# Patient Record
Sex: Male | Born: 1955
Health system: Southern US, Community
[De-identification: ages and names within clinical notes are randomized; demographics above are authoritative.]

## PROBLEM LIST (undated history)

## (undated) DIAGNOSIS — K638219 Small intestinal bacterial overgrowth, unspecified: Secondary | ICD-10-CM

## (undated) DIAGNOSIS — Z9049 Acquired absence of other specified parts of digestive tract: Secondary | ICD-10-CM

## (undated) DIAGNOSIS — K635 Polyp of colon: Secondary | ICD-10-CM

## (undated) DIAGNOSIS — E785 Hyperlipidemia, unspecified: Secondary | ICD-10-CM

## (undated) DIAGNOSIS — G8929 Other chronic pain: Secondary | ICD-10-CM

## (undated) DIAGNOSIS — T7840XA Allergy, unspecified, initial encounter: Secondary | ICD-10-CM

## (undated) DIAGNOSIS — M5416 Radiculopathy, lumbar region: Secondary | ICD-10-CM

## (undated) DIAGNOSIS — Z8249 Family history of ischemic heart disease and other diseases of the circulatory system: Secondary | ICD-10-CM

## (undated) DIAGNOSIS — K6389 Other specified diseases of intestine: Secondary | ICD-10-CM

## (undated) DIAGNOSIS — F419 Anxiety disorder, unspecified: Secondary | ICD-10-CM

## (undated) DIAGNOSIS — Z8719 Personal history of other diseases of the digestive system: Secondary | ICD-10-CM

## (undated) DIAGNOSIS — Z8042 Family history of malignant neoplasm of prostate: Secondary | ICD-10-CM

## (undated) DIAGNOSIS — Z9889 Other specified postprocedural states: Secondary | ICD-10-CM

## (undated) DIAGNOSIS — E041 Nontoxic single thyroid nodule: Secondary | ICD-10-CM

## (undated) DIAGNOSIS — E7800 Pure hypercholesterolemia, unspecified: Secondary | ICD-10-CM

## (undated) DIAGNOSIS — L309 Dermatitis, unspecified: Secondary | ICD-10-CM

## (undated) HISTORY — PX: LUMBAR DISC SURGERY: SHX700

## (undated) HISTORY — DX: Other chronic pain: G89.29

## (undated) HISTORY — DX: Small intestinal bacterial overgrowth, unspecified: K63.8219

## (undated) HISTORY — PX: APPENDECTOMY: SHX54

## (undated) HISTORY — DX: Allergy, unspecified, initial encounter: T78.40XA

## (undated) HISTORY — DX: Acquired absence of other specified parts of digestive tract: Z90.49

## (undated) HISTORY — DX: Dermatitis, unspecified: L30.9

## (undated) HISTORY — DX: Polyp of colon: K63.5

## (undated) HISTORY — DX: Hyperlipidemia, unspecified: E78.5

## (undated) HISTORY — PX: COLONOSCOPY: SHX174

## (undated) HISTORY — DX: Family history of malignant neoplasm of prostate: Z80.42

## (undated) HISTORY — PX: INGUINAL HERNIA REPAIR: SUR1180

## (undated) HISTORY — DX: Anxiety disorder, unspecified: F41.9

## (undated) HISTORY — DX: Pure hypercholesterolemia, unspecified: E78.00

## (undated) HISTORY — DX: Personal history of other diseases of the digestive system: Z87.19

## (undated) HISTORY — DX: Family history of ischemic heart disease and other diseases of the circulatory system: Z82.49

## (undated) HISTORY — DX: Other specified diseases of intestine: K63.89

## (undated) HISTORY — DX: Other specified postprocedural states: Z98.890

---
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
## 1997-06-11 HISTORY — PX: LUMBAR DISC SURGERY: SHX700

## 2005-07-18 ENCOUNTER — Ambulatory Visit: Payer: Self-pay | Admitting: Family Medicine

## 2006-01-03 ENCOUNTER — Ambulatory Visit: Payer: Self-pay | Admitting: Family Medicine

## 2006-01-07 ENCOUNTER — Ambulatory Visit: Payer: Self-pay | Admitting: Family Medicine

## 2007-03-10 ENCOUNTER — Ambulatory Visit: Payer: Self-pay | Admitting: Family Medicine

## 2007-03-10 LAB — CONVERTED CEMR LAB
ALT: 33 units/L (ref 0–53)
AST: 30 units/L (ref 0–37)
Albumin: 4.1 g/dL (ref 3.5–5.2)
Alkaline Phosphatase: 51 units/L (ref 39–117)
BUN: 11 mg/dL (ref 6–23)
Basophils Absolute: 0 10*3/uL (ref 0.0–0.1)
Basophils Relative: 0.2 % (ref 0.0–1.0)
Bilirubin, Direct: 0.1 mg/dL (ref 0.0–0.3)
CO2: 31 meq/L (ref 19–32)
Calcium: 9.7 mg/dL (ref 8.4–10.5)
Chloride: 106 meq/L (ref 96–112)
Cholesterol: 275 mg/dL (ref 0–200)
Creatinine, Ser: 1 mg/dL (ref 0.4–1.5)
Direct LDL: 192.6 mg/dL
Eosinophils Absolute: 0.1 10*3/uL (ref 0.0–0.6)
Eosinophils Relative: 1.7 % (ref 0.0–5.0)
GFR calc Af Amer: 102 mL/min
GFR calc non Af Amer: 84 mL/min
Glucose, Bld: 92 mg/dL (ref 70–99)
HCT: 46.6 % (ref 39.0–52.0)
HDL: 62.6 mg/dL (ref >39.0)
Hemoglobin: 16.3 g/dL (ref 13.0–17.0)
Lymphocytes Relative: 32.9 % (ref 12.0–46.0)
MCHC: 34.9 g/dL (ref 30.0–36.0)
MCV: 92.1 fL (ref 78.0–100.0)
Monocytes Absolute: 0.5 10*3/uL (ref 0.2–0.7)
Monocytes Relative: 10.5 % (ref 3.0–11.0)
Neutro Abs: 2.8 10*3/uL (ref 1.4–7.7)
Neutrophils Relative %: 54.7 % (ref 43.0–77.0)
PSA: 0.45 ng/mL (ref 0.10–4.00)
Platelets: 236 10*3/uL (ref 150–400)
Potassium: 4.2 meq/L (ref 3.5–5.1)
RBC: 5.06 M/uL (ref 4.22–5.81)
RDW: 12.4 % (ref 11.5–14.6)
Sodium: 143 meq/L (ref 135–145)
TSH: 0.72 microintl units/mL (ref 0.35–5.50)
Total Bilirubin: 0.9 mg/dL (ref 0.3–1.2)
Total CHOL/HDL Ratio: 4.4
Total Protein: 6.6 g/dL (ref 6.0–8.3)
Triglycerides: 111 mg/dL (ref 0–149)
VLDL: 22 mg/dL (ref 0–40)
WBC: 5 10*3/uL (ref 4.5–10.5)

## 2007-03-17 ENCOUNTER — Ambulatory Visit: Payer: Self-pay | Admitting: Family Medicine

## 2007-03-17 DIAGNOSIS — E782 Mixed hyperlipidemia: Secondary | ICD-10-CM | POA: Insufficient documentation

## 2007-03-17 DIAGNOSIS — F411 Generalized anxiety disorder: Secondary | ICD-10-CM | POA: Insufficient documentation

## 2007-03-17 DIAGNOSIS — L259 Unspecified contact dermatitis, unspecified cause: Secondary | ICD-10-CM | POA: Insufficient documentation

## 2007-08-01 ENCOUNTER — Telehealth: Payer: Self-pay | Admitting: Family Medicine

## 2007-10-07 ENCOUNTER — Ambulatory Visit: Payer: Self-pay | Admitting: Family Medicine

## 2008-07-02 ENCOUNTER — Ambulatory Visit: Payer: Self-pay | Admitting: Family Medicine

## 2008-07-20 ENCOUNTER — Ambulatory Visit: Payer: Self-pay | Admitting: Gastroenterology

## 2009-02-22 ENCOUNTER — Ambulatory Visit: Payer: Self-pay | Admitting: Family Medicine

## 2009-02-22 DIAGNOSIS — L0202 Furuncle of face: Secondary | ICD-10-CM | POA: Insufficient documentation

## 2009-02-22 DIAGNOSIS — L0203 Carbuncle of face: Secondary | ICD-10-CM

## 2009-08-23 ENCOUNTER — Encounter: Payer: Self-pay | Admitting: Family Medicine

## 2010-03-01 ENCOUNTER — Ambulatory Visit: Payer: Self-pay | Admitting: Family Medicine

## 2010-07-09 LAB — CONVERTED CEMR LAB
ALT: 22 units/L (ref 0–53)
AST: 24 units/L (ref 0–37)
Albumin: 4.2 g/dL (ref 3.5–5.2)
Alkaline Phosphatase: 47 units/L (ref 39–117)
BUN: 13 mg/dL (ref 6–23)
Basophils Absolute: 0 10*3/uL (ref 0.0–0.1)
Basophils Relative: 0.2 % (ref 0.0–3.0)
Bilirubin Urine: NEGATIVE
Bilirubin, Direct: 0.1 mg/dL (ref 0.0–0.3)
Blood in Urine, dipstick: NEGATIVE
CO2: 31 meq/L (ref 19–32)
Calcium: 9.7 mg/dL (ref 8.4–10.5)
Chloride: 104 meq/L (ref 96–112)
Cholesterol: 202 mg/dL (ref 0–200)
Creatinine, Ser: 1 mg/dL (ref 0.4–1.5)
Direct LDL: 127.6 mg/dL
Eosinophils Absolute: 0.1 10*3/uL (ref 0.0–0.7)
Eosinophils Relative: 1.7 % (ref 0.0–5.0)
GFR calc Af Amer: 102 mL/min
GFR calc non Af Amer: 84 mL/min
Glucose, Bld: 86 mg/dL (ref 70–99)
Glucose, Urine, Semiquant: NEGATIVE
HCT: 45.1 % (ref 39.0–52.0)
HDL: 56.9 mg/dL (ref >39.0)
Hemoglobin: 15.8 g/dL (ref 13.0–17.0)
Ketones, urine, test strip: NEGATIVE
Lymphocytes Relative: 33.4 % (ref 12.0–46.0)
MCHC: 35.1 g/dL (ref 30.0–36.0)
MCV: 93.1 fL (ref 78.0–100.0)
Monocytes Absolute: 0.5 10*3/uL (ref 0.1–1.0)
Monocytes Relative: 10.9 % (ref 3.0–12.0)
Neutro Abs: 2.4 10*3/uL (ref 1.4–7.7)
Neutrophils Relative %: 53.8 % (ref 43.0–77.0)
Nitrite: NEGATIVE
PSA: 0.44 ng/mL (ref 0.10–4.00)
Platelets: 216 10*3/uL (ref 150–400)
Potassium: 4.1 meq/L (ref 3.5–5.1)
Protein, U semiquant: NEGATIVE
RBC: 4.84 M/uL (ref 4.22–5.81)
RDW: 12.3 % (ref 11.5–14.6)
Sodium: 140 meq/L (ref 135–145)
Specific Gravity, Urine: 1.02
TSH: 1.67 microintl units/mL (ref 0.35–5.50)
Total Bilirubin: 1.1 mg/dL (ref 0.3–1.2)
Total CHOL/HDL Ratio: 3.6
Total Protein: 7.1 g/dL (ref 6.0–8.3)
Triglycerides: 76 mg/dL (ref 0–149)
Urobilinogen, UA: 0.2
VLDL: 15 mg/dL (ref 0–40)
WBC Urine, dipstick: NEGATIVE
WBC: 4.5 10*3/uL (ref 4.5–10.5)
pH: 7

## 2010-07-11 NOTE — Miscellaneous (Signed)
Summary: refill  Clinical Lists Changes  Medications: Added new medication of SIMVASTATIN 20 MG TABS (SIMVASTATIN) take one tab at bedtime - Signed Rx of SIMVASTATIN 20 MG TABS (SIMVASTATIN) take one tab at bedtime;  #30 x 0;  Signed;  Entered by: Kern Reap CMA (AAMA);  Authorized by: Roderick Pee MD;  Method used: [facility]ically to [facility]  [phone]*, [address], [place], [place]  [address], Ph: [phone] or [phone], Fax: [phone]    Prescriptions: SIMVASTATIN 20 MG TABS (SIMVASTATIN) take one tab at bedtime  #30 x 0   Entered by:   Kern Reap CMA (AAMA)   Authorized by:   Roderick Pee MD   Signed by:   Kern Reap CMA (AAMA) on 08/23/2009   Method used:   Electronically to        [facility]  [phone]* (retail)       [address] [place], [place]  [address]       Ph: [phone] or [phone]       Fax: [phone]   RxID:   [id number]

## 2010-07-11 NOTE — Assessment & Plan Note (Signed)
Summary: cpx/njr rsc per pt/njr   Vital Signs:  Patient Profile:   55 Years Old Male Height:     71 inches (180.34 cm) Weight:      199 pounds (90.45 kg) Temp:     98.2 degrees F (36.78 degrees C) oral Pulse rate:   70 / minute BP sitting:   120 / 82  (right arm)  Pt. in pain?   no  Vitals Entered By: Arcola Jansky, RN (March 17, 2007 4:24 PM)                  Chief Complaint:  cpx, labs done, and 1) stress 2)hands .  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  He was hospitalized as a child for bilateral inguinal hernias and also as a teenager for an appendectomy.  He's otherwise been in good health.  No medical problems.  Review of systems pertinent positives.  He does have eczema involving his hands.  He sternum, number medications nothing is cured the problem.  It comes and goes.  He seen in number specialist from dermatology who have been unable to cure the problem.  He uses steroid cream on a regular basis and when his skin cracks take some oral prednisone for a couple days.  Rest of systems negative social history married.  His wife are having issues is currently going to a psychologist to learn how to deal with stress and anxiety.  Question taken any medication at this juncture.  Family history pertinent mother recently died in her sleep at age 53 of an MI mother at father had prostate cancer.  One brother two sisters, both in good health.  No problems.  Last tetanus unknown.  Will give him a flu and a tetanus booster today.   Current Allergies: No known allergies   Past Medical History:    Reviewed history and no changes required:       Anxiety       eczema hands, chronic and recurrent       appendectomy       bilateral inguinal hernia repair at age 16 weeks   Family History:    Reviewed history from 01/17/2007 and no changes required:       Family History Diabetes 1st degree  relative       Family History Hypertension       Family History Other cancer-Prostate       Family History of Cardiovascular disorder  Social History:    Reviewed history from 01/17/2007 and no changes required:       Never Smoked       Alcohol use-no       Married       Drug use-no       Regular exercise-yes   Risk Factors:  Tobacco use:  never Drug use:  no Exercise:  yes   Review of Systems      See HPI   Physical Exam  General:     Well-developed,well-nourished,in no acute distress; alert,appropriate and cooperative throughout examination Head:     Normocephalic and atraumatic without obvious abnormalities. No apparent alopecia or balding. Eyes:     No corneal or conjunctival inflammation noted. EOMI. Perrla. Funduscopic exam benign, without hemorrhages, exudates or papilledema. Vision grossly normal. Ears:     External ear exam shows no significant lesions or deformities.  Otoscopic examination reveals clear canals, tympanic membranes are intact bilaterally  without bulging, retraction, inflammation or discharge. Hearing is grossly normal bilaterally. Nose:     External nasal examination shows no deformity or inflammation. Nasal mucosa are pink and moist without lesions or exudates. Mouth:     Oral mucosa and oropharynx without lesions or exudates.  Teeth in good repair. Neck:     No deformities, masses, or tenderness noted. Chest Wall:     No deformities, masses, tenderness or gynecomastia noted. Breasts:     No masses or gynecomastia noted Lungs:     Normal respiratory effort, chest expands symmetrically. Lungs are clear to auscultation, no crackles or wheezes. Heart:     Normal rate and regular rhythm. S1 and S2 normal without gallop, murmur, click, rub or other extra sounds. Abdomen:     Bowel sounds positive,abdomen soft and non-tender without masses, organomegaly or hernias noted. Rectal:     No external abnormalities noted. Normal sphincter tone. No  rectal masses or tenderness. Genitalia:     Testes bilaterally descended without nodularity, tenderness or masses. No scrotal masses or lesions. No penis lesions or urethral discharge. Prostate:     Prostate gland firm and smooth, no enlargement, nodularity, tenderness, mass, asymmetry or induration. Msk:     No deformity or scoliosis noted of thoracic or lumbar spine.   Pulses:     R and L carotid,radial,femoral,dorsalis pedis and posterior tibial pulses are full and equal bilaterally Extremities:     No clubbing, cyanosis, edema, or deformity noted with normal full range of motion of all joints.   Neurologic:     No cranial nerve deficits noted. Station and gait are normal. Plantar reflexes are down-going bilaterally. DTRs are symmetrical throughout. Sensory, motor and coordinative functions appear intact. Skin:     Intact without suspicious lesions or rashes Cervical Nodes:     No lymphadenopathy noted Axillary Nodes:     No palpable lymphadenopathy Inguinal Nodes:     No significant adenopathy Psych:     Cognition and judgment appear intact. Alert and cooperative with normal attention span and concentration. No apparent delusions, illusions, hallucinations    Impression & Recommendations:  Problem # 1:  HEALTH SCREENING (ICD-V70.0) Assessment: Unchanged  Orders: EKG w/ Interpretation (93000)   Problem # 2:  ANXIETY (ICD-300.00) Assessment: Unchanged  His updated medication list for this problem includes:    Celexa 20 Mg Tabs (Citalopram hydrobromide) .Marland Kitchen... 1 tab @ bedtime   Problem # 3:  DERMATITIS, CONTACT, NOS (ICD-692.9) lot [id number], EXP 30 jun 09, sanofi pasteur left deltoid IM, 0.5 cc.  The following medications were removed from the medication list:    Prednisone 20 Mg Tabs (Prednisone)  His updated medication list for this problem includes:    Prednisone 20 Mg Tabs (Prednisone) ..... Uad    Lidex 0.05 % Crea (Fluocinonide) .Marland Kitchen... Apply qhs   Problem #  4:  HYPERLIPIDEMIA, MIXED (ICD-272.2) Assessment: New  His updated medication list for this problem includes:    Zocor 20 Mg Tabs (Simvastatin) .Marland Kitchen... 1 tab @ bedtime   Complete Medication List: 1)  Prednisone 20 Mg Tabs (Prednisone) .... Uad 2)  Lidex 0.05 % Crea (Fluocinonide) .... Apply qhs 3)  Celexa 20 Mg Tabs (Citalopram hydrobromide) .Marland Kitchen.. 1 tab @ bedtime 4)  Zocor 20 Mg Tabs (Simvastatin) .Marland Kitchen.. 1 tab @ bedtime  Other Orders: Flu Vaccine 16yrs + (09604) Admin 1st Vaccine (54098) Tetanus Toxoid w/Dx (11914) Admin of Any Addtl Vaccine (78295)   Patient Instructions: 1)  Please schedule a follow-up appointment  in 1 year. 2)  It is important that you exercise regularly at least 20 minutes 5 times a week. If you develop chest pain, have severe difficulty breathing, or feel very tired , stop exercising immediately and seek medical attention. 3)  Take an Aspirin every day. 4)  I would like to the prescription for the Lidex ointment/cream to use if he has severe eczema.  Also I will write you triamcinolone with Eucerin to take for minor problems.  Also, right some prednisone to take two a day until the hand eczema quiets down when it becomes severe.  He also might benefit from taking a mild dose of Celexa 20 mg at bedtime to see if that would help with her mood and anxiety.  Return p.r.n. 5)  return two months for fasting lipid and liver panel, code number 272.0     Prescriptions: ZOCOR 20 MG  TABS (SIMVASTATIN) 1 tab @ bedtime  #100 x 4   Entered and Authorized by:   Roderick Pee MD   Signed by:   Roderick Pee MD on 03/17/2007   Method used:   Print then Give to Patient   RxID:   [id number] CELEXA 20 MG  TABS (CITALOPRAM HYDROBROMIDE) 1 tab @ bedtime  #100 x 4   Entered and Authorized by:   Roderick Pee MD   Signed by:   Roderick Pee MD on 03/17/2007   Method used:   Print then Give to Patient   RxID:   [id number] LIDEX 0.05 %  CREA (FLUOCINONIDE) apply qhs   #60 grms x 4   Entered and Authorized by:   Roderick Pee MD   Signed by:   Roderick Pee MD on 03/17/2007   Method used:   Print then Give to Patient   RxID:   [id number] PREDNISONE 20 MG  TABS (PREDNISONE) uad  #50 x 2   Entered and Authorized by:   Roderick Pee MD   Signed by:   Roderick Pee MD on 03/17/2007   Method used:   Print then Give to Patient   RxID:   [id number]  ]  Tetanus/Td Vaccine    Vaccine Type: Td    Site: right deltoid    Mfr: Sanofi Pasteur    Dose: 0.5 ml    Route: IM    Given by: Arcola Jansky, RN    Exp. Date: 08/27/2008    Lot #: [id number]    VIS given: 12/20/04 version given March 17, 2007.  Influenza Vaccine    Vaccine Type: Fluvax 3+    Given by: Arcola Jansky, RN  Flu Vaccine Consent Questions    Do you have a history of severe allergic reactions to this vaccine? no    Any prior history of allergic reactions to egg and/or gelatin? no    Do you have a sensitivity to the preservative Thimersol? no    Do you have a past history of Guillan-Barre Syndrome? no    Do you currently have an acute febrile illness? no    Have you ever had a severe reaction to latex? no    Vaccine information given and explained to patient? yes
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
## 2010-07-11 NOTE — Assessment & Plan Note (Signed)
Summary: cpx patient fasting/mhf   Vital Signs:  Patient Profile:   55 Years Old Male Height:     69.5 inches (180.34 cm) Weight:      198 pounds Temp:     98.3 degrees F oral Pulse rate:   68 / minute BP sitting:   120 / 80  (left arm) Cuff size:   regular  Vitals Entered By: Kern Reap CMA (July 02, 2008 8:24 AM)                 Chief Complaint:  cpx.  History of Present Illness: Mason Irwin is a 55 year old, married male, nonsmoker, who comes in today for a physical evaluation because of underlying hyperlipidemia eczema and anxiety.  His hyperlipidemia.  His been managed with Zocor 20 mg nightly.  He did not get his fasting labs last week.  Will get them today.  The eczema history with over-the-counter medications.  He is on how to deal with it.  Anxiety is lessened.  Is not taking Celexa.  Last tetanus booster 2008 seasonal flu 2009 will give him an H1 N1 today.  Patient due for screening colonoscopy.  Will set that up in GI    Updated Prior Medication List: PREDNISONE 20 MG  TABS (PREDNISONE) uad CELEXA 20 MG  TABS (CITALOPRAM HYDROBROMIDE) 1 tab @ bedtime--prn ZOCOR 20 MG  TABS (SIMVASTATIN) 1 tab @ bedtime--sometimes  Current Allergies: No known allergies   Past Medical History:    Reviewed history from 03/17/2007 and no changes required:       Anxiety       eczema hands, chronic and recurrent       appendectomy       bilateral inguinal hernia repair at age 90 weeks  Past Surgical History:    Reviewed history from 01/17/2007 and no changes required:       Appendectomy   Family History:    Reviewed history from 01/17/2007 and no changes required:       Family History Diabetes 1st degree relative       Family History Hypertension       Family History Other cancer-Prostate       Family History of Cardiovascular disorder  Social History:    Reviewed history from 03/17/2007 and no changes required:       Never Smoked       Alcohol use-no  Married       Drug use-no       Regular exercise-yes    Review of Systems      See HPI   Physical Exam  General:     Well-developed,well-nourished,in no acute distress; alert,appropriate and cooperative throughout examination Head:     Normocephalic and atraumatic without obvious abnormalities. No apparent alopecia or balding. Eyes:     No corneal or conjunctival inflammation noted. EOMI. Perrla. Funduscopic exam benign, without hemorrhages, exudates or papilledema. Vision grossly normal. Ears:     External ear exam shows no significant lesions or deformities.  Otoscopic examination reveals clear canals, tympanic membranes are intact bilaterally without bulging, retraction, inflammation or discharge. Hearing is grossly normal bilaterally. Nose:     External nasal examination shows no deformity or inflammation. Nasal mucosa are pink and moist without lesions or exudates. Mouth:     Oral mucosa and oropharynx without lesions or exudates.  Teeth in good repair. Neck:     No deformities, masses, or tenderness noted. Chest Wall:     No deformities, masses, tenderness or gynecomastia noted.  Breasts:     No masses or gynecomastia noted Lungs:     Normal respiratory effort, chest expands symmetrically. Lungs are clear to auscultation, no crackles or wheezes. Heart:     Normal rate and regular rhythm. S1 and S2 normal without gallop, murmur, click, rub or other extra sounds. Abdomen:     Bowel sounds positive,abdomen soft and non-tender without masses, organomegaly or hernias noted. Rectal:     No external abnormalities noted. Normal sphincter tone. No rectal masses or tenderness. Genitalia:     Testes bilaterally descended without nodularity, tenderness or masses. No scrotal masses or lesions. No penis lesions or urethral discharge. Prostate:     Prostate gland firm and smooth, no enlargement, nodularity, tenderness, mass, asymmetry or induration. Msk:     No deformity or scoliosis  noted of thoracic or lumbar spine.   Pulses:     R and L carotid,radial,femoral,dorsalis pedis and posterior tibial pulses are full and equal bilaterally Extremities:     No clubbing, cyanosis, edema, or deformity noted with normal full range of motion of all joints.   Neurologic:     No cranial nerve deficits noted. Station and gait are normal. Plantar reflexes are down-going bilaterally. DTRs are symmetrical throughout. Sensory, motor and coordinative functions appear intact. Skin:     Intact without suspicious lesions or rashes Cervical Nodes:     No lymphadenopathy noted Axillary Nodes:     No palpable lymphadenopathy Inguinal Nodes:     No significant adenopathy Psych:     Cognition and judgment appear intact. Alert and cooperative with normal attention span and concentration. No apparent delusions, illusions, hallucinations    Impression & Recommendations:  Problem # 1:  HYPERLIPIDEMIA, MIXED (ICD-272.2) Assessment: Unchanged  His updated medication list for this problem includes:    Zocor 20 Mg Tabs (Simvastatin) .Marland Kitchen... 1 tab @ bedtime--sometimes  Orders: Venipuncture (60454) TLB-Lipid Panel (80061-LIPID) TLB-BMP (Basic Metabolic Panel-BMET) (80048-METABOL) TLB-CBC Platelet - w/Differential (85025-CBCD) TLB-Hepatic/Liver Function Pnl (80076-HEPATIC) TLB-TSH (Thyroid Stimulating Hormone) (84443-TSH) TLB-PSA (Prostate Specific Antigen) (84153-PSA) UA Dipstick w/o Micro (automated)  (81003) EKG w/ Interpretation (93000)   Problem # 2:  DERMATITIS, CONTACT, NOS (ICD-692.9) Assessment: Unchanged  The following medications were removed from the medication list:    Lidex 0.05 % Crea (Fluocinonide) .Marland Kitchen... Apply at bedtime-as needed  His updated medication list for this problem includes:    Prednisone 20 Mg Tabs (Prednisone) ..... Uad  Orders: Venipuncture (09811) TLB-Lipid Panel (80061-LIPID) TLB-BMP (Basic Metabolic Panel-BMET) (80048-METABOL) TLB-CBC Platelet -  w/Differential (85025-CBCD) TLB-Hepatic/Liver Function Pnl (80076-HEPATIC) TLB-TSH (Thyroid Stimulating Hormone) (84443-TSH) TLB-PSA (Prostate Specific Antigen) (84153-PSA) UA Dipstick w/o Micro (automated)  (81003)   Problem # 3:  ANXIETY (ICD-300.00) Assessment: Improved  His updated medication list for this problem includes:    Celexa 20 Mg Tabs (Citalopram hydrobromide) .Marland Kitchen... 1 tab @ bedtime--prn  Orders: Venipuncture (91478) TLB-Lipid Panel (80061-LIPID) TLB-BMP (Basic Metabolic Panel-BMET) (80048-METABOL) TLB-CBC Platelet - w/Differential (85025-CBCD) TLB-Hepatic/Liver Function Pnl (80076-HEPATIC) TLB-TSH (Thyroid Stimulating Hormone) (84443-TSH) TLB-PSA (Prostate Specific Antigen) (84153-PSA) UA Dipstick w/o Micro (automated)  (81003)   Complete Medication List: 1)  Prednisone 20 Mg Tabs (Prednisone) .... Uad 2)  Celexa 20 Mg Tabs (Citalopram hydrobromide) .Marland Kitchen.. 1 tab @ bedtime--prn 3)  Zocor 20 Mg Tabs (Simvastatin) .Marland Kitchen.. 1 tab @ bedtime--sometimes  Other Orders: Gastroenterology Referral (GI) H1N1 vaccine G code (G9562) Influenza A (H1N1) adm  fee Medicare/Non Medicare [phone])   Patient Instructions: 1)  Please schedule a follow-up appointment in 1 year. 2)  Schedule a colonoscopy/sigmoidoscopy to help detect colon cancer. 3)  Take an Aspirin every day.   Prescriptions: ZOCOR 20 MG  TABS (SIMVASTATIN) 1 tab @ bedtime--sometimes  #100 Tablet x 4   Entered and Authorized by:   Roderick Pee MD   Signed by:   Roderick Pee MD on 07/02/2008   Method used:   [facility] to        [facility]  [phone]* (retail)       [address] [place], [place]  [address]       Ph: [phone] or [phone]       Fax: [phone]   RxID:   [id number]    H1N1 # 1    Vaccine Type: H1N1 vaccine G code    Site: left deltoid    Mfr: novartis    Dose: 0.5 ml    Route: IM    Given by: Kern Reap CMA    Exp. Date: 09/09/2008    Lot #:  [id number]   Laboratory Results   Urine Tests   Date/Time Reported: July 02, 2008 12:47 PM   Routine Urinalysis   Color: yellow Appearance: Clear Glucose: negative   (Normal Range: Negative) Bilirubin: negative   (Normal Range: Negative) Ketone: negative   (Normal Range: Negative) Spec. Gravity: 1.020   (Normal Range: 1.003-1.035) Blood: negative   (Normal Range: Negative) pH: 7.0   (Normal Range: 5.0-8.0) Protein: negative   (Normal Range: Negative) Urobilinogen: 0.2   (Normal Range: 0-1) Nitrite: negative   (Normal Range: Negative) Leukocyte Esterace: negative   (Normal Range: Negative)    Comments: Wynona Canes, CMA  July 02, 2008 12:47 PM       ANTICOAGULATION RECORD       NEW REGIMEN & LAB RESULTS Regimen:   (no change)  MEDICATIONS PREDNISONE 20 MG  TABS (PREDNISONE) uad CELEXA 20 MG  TABS (CITALOPRAM HYDROBROMIDE) 1 tab @ bedtime--prn ZOCOR 20 MG  TABS (SIMVASTATIN) 1 tab @ bedtime--sometimes

## 2010-07-11 NOTE — Assessment & Plan Note (Signed)
Summary: FLU SHOT // RS  Nurse Visit   Vitals Entered By: Duard Brady LPN (March 01, 2010 4:10 PM)  Allergies: No Known Drug Allergies  Orders Added: 1)  Admin 1st Vaccine [90471] 2)  Flu Vaccine 5yrs + [16109] Flu Vaccine Consent Questions     Do you have a history of severe allergic reactions to this vaccine? no    Any prior history of allergic reactions to egg and/or gelatin? no    Do you have a sensitivity to the preservative Thimersol? no    Do you have a past history of Guillan-Barre Syndrome? no    Do you currently have an acute febrile illness? no    Have you ever had a severe reaction to latex? no    Vaccine information given and explained to patient? yes    Are you currently pregnant? no    Lot Number:[id number]   Exp Date:12/09/2010   Site Given  Left Deltoid IM .lbflu

## 2010-07-11 NOTE — Miscellaneous (Signed)
Summary: [facility] Previsit/prep  Clinical Lists Changes  Medications: Added new medication of MOVIPREP 100 GM  SOLR (PEG-KCL-NACL-NASULF-NA ASC-C) As per prep instructions. - Signed Rx of MOVIPREP 100 GM  SOLR (PEG-KCL-NACL-NASULF-NA ASC-C) As per prep instructions.;  #1 x 0;  Signed;  Entered by: Wyona Almas RN;  Authorized by: Meryl Dare MD [facility];  Method used: [facility]  [phone]*, [address], [place], [place]  [address], Ph: [phone] or [phone], Fax: [phone] Observations: Added new observation of NKA: T (07/20/2008 15:25)    Prescriptions: MOVIPREP 100 GM  SOLR (PEG-KCL-NACL-NASULF-NA ASC-C) As per prep instructions.  #1 x 0   Entered by:   Wyona Almas RN   Authorized by:   Meryl Dare MD [facility]   Signed by:   Wyona Almas RN on 07/20/2008   Method used:   Electronically to        [facility]  [phone]* (retail)       [address] [place], [place]  [address]       Ph: [phone] or [phone]       Fax: [phone]   RxID:   [id number]

## 2010-07-11 NOTE — Progress Notes (Signed)
Summary: refill zocor  Phone Note Call from Patient Call back at [phone]   Caller: patient wife Call For: Derico Mitton Summary of Call: Dr Tawanna Cooler gave him an rx for heart medicine and the patient never had it filled.  He has lost the rx.  Could Dr Tawanna Cooler call in to [facility] [address].   Initial call taken by: Roselle Locus,  August 01, 2007 12:02 PM  Follow-up for Phone Call        No heart medicine on list.  Called and asked pt to call back with med he is missing Rx for. Follow-up by: Gladis Riffle, RN,  August 01, 2007 4:06 PM  Additional Follow-up for Phone Call Additional follow up Details #1::        Zocor 20 mg, dispense 100 tablets directions are one at bed time with a follow-up lipid panel and liver panel in April.  Code number 272.0 Additional Follow-up by: Roderick Pee MD,  August 04, 2007 5:56 PM    Additional Follow-up for Phone Call Additional follow up Details #2::    zocor called to pharm Follow-up by: Sindy Guadeloupe RN,  August 05, 2007 10:07 AM  New/Updated Medications: ZOCOR 20 MG  TABS (SIMVASTATIN) 1 tab @ bedtime   Prescriptions: ZOCOR 20 MG  TABS (SIMVASTATIN) 1 tab @ bedtime  #100 x 0   Entered by:   Sindy Guadeloupe RN   Authorized by:   Roderick Pee MD   Signed by:   Sindy Guadeloupe RN on 08/05/2007   Method used:   Telephoned to ...       [facility]  [phone]*       [address]       [place], [place]  [address]       Ph: [phone] or [phone]       Fax: [phone]   RxID:   [id number]

## 2010-07-11 NOTE — Assessment & Plan Note (Signed)
Summary: reck meds/jls   Vital Signs:  Patient Profile:   55 Years Old Male Height:     71 inches (180.34 cm) Weight:      189 pounds Temp:     98.3 degrees F BP sitting:   116 / 76  Vitals Entered By: Sindy Guadeloupe RN (October 07, 2007 9:33 AM)                 Chief Complaint:  follow up--reck meds--fasting.  History of Present Illness: Mason Irwin is a 55 year old male, who comes in today for follow-up of hyperlipidemia.  We saw him last fall and had started Zocor 20 mg nightly as a starting dose.  He has marked elevation of his total and LDL and his father had bypass surgery.  Age 93.  The risk factors father had with the hyperlipidemia.  He is not been taking his Zocor on a regular basis.  He comes in today fasting wanting follow-up labs.  I explained to him that we would need to take the medication and a daily basis before we could judge the efficacy of the 20 mg dose.  We therefore get him back in June for follow-up.  I spent 15 minutes going through the risk factors for coronary disease with hyperlipidemia.    Current Allergies (reviewed today): No known allergies       Physical Exam  General:     Well-developed,well-nourished,in no acute distress; alert,appropriate and cooperative throughout examination    Impression & Recommendations:  Problem # 1:  HYPERLIPIDEMIA, MIXED (ICD-272.2) Assessment: Unchanged  His updated medication list for this problem includes:    Zocor 20 Mg Tabs (Simvastatin) .Marland Kitchen... 1 tab @ bedtime--sometimes   Complete Medication List: 1)  Prednisone 20 Mg Tabs (Prednisone) .... Uad 2)  Lidex 0.05 % Crea (Fluocinonide) .... Apply at bedtime-as needed 3)  Celexa 20 Mg Tabs (Citalopram hydrobromide) .Marland Kitchen.. 1 tab @ bedtime--prn 4)  Zocor 20 Mg Tabs (Simvastatin) .Marland Kitchen.. 1 tab @ bedtime--sometimes   Patient Instructions: 1)  takes Zocor 20 mg every night at bedtime.  Return in two months for follow-up lipid and liver panel.  Code number  272.0 2)  Please return for lab work one (1) week before your next appointment.     ]

## 2010-08-06 ENCOUNTER — Other Ambulatory Visit: Payer: Self-pay | Admitting: Family Medicine

## 2010-08-07 ENCOUNTER — Telehealth: Payer: Self-pay | Admitting: Family Medicine

## 2010-08-07 DIAGNOSIS — E785 Hyperlipidemia, unspecified: Secondary | ICD-10-CM

## 2010-08-07 MED ORDER — SIMVASTATIN 20 MG PO TABS: 20.0000 mg | ORAL_TABLET | Freq: Every day | ORAL | Status: DC

## 2010-08-07 NOTE — Telephone Encounter (Signed)
Refill Simvastatin 20mg ...[facility].

## 2010-10-27 ENCOUNTER — Telehealth: Payer: Self-pay | Admitting: Family Medicine

## 2010-10-27 NOTE — Telephone Encounter (Signed)
Pt called and needs refill of [facility]atin [facility] [address]

## 2010-10-27 NOTE — Telephone Encounter (Signed)
Pt has been sch for ov as noted.

## 2010-10-27 NOTE — Telephone Encounter (Signed)
patient  Needs to schedule an office visit for more refills.

## 2010-11-09 ENCOUNTER — Ambulatory Visit (INDEPENDENT_AMBULATORY_CARE_PROVIDER_SITE_OTHER): Payer: BC Managed Care – PPO | Admitting: Family Medicine

## 2010-11-09 ENCOUNTER — Encounter: Payer: Self-pay | Admitting: Family Medicine

## 2010-11-09 VITALS — BP 110/70 | Ht 71.0 in | Wt 196.0 lb

## 2010-11-09 DIAGNOSIS — B009 Herpesviral infection, unspecified: Secondary | ICD-10-CM

## 2010-11-09 DIAGNOSIS — E782 Mixed hyperlipidemia: Secondary | ICD-10-CM

## 2010-11-09 DIAGNOSIS — E785 Hyperlipidemia, unspecified: Secondary | ICD-10-CM

## 2010-11-09 DIAGNOSIS — Z0000 Encounter for general adult medical examination without abnormal findings: Secondary | ICD-10-CM

## 2010-11-09 LAB — POCT URINALYSIS DIPSTICK
Bilirubin, UA: NEGATIVE
Blood, UA: NEGATIVE
Glucose, UA: NEGATIVE
Ketones, UA: NEGATIVE
Leukocytes, UA: NEGATIVE
Nitrite, UA: NEGATIVE
Protein, UA: NEGATIVE
Spec Grav, UA: 1.02
Urobilinogen, UA: 0.2
pH, UA: 7

## 2010-11-09 LAB — LIPID PANEL
Cholesterol: 222 mg/dL — ABNORMAL HIGH (ref 0–200)
HDL: 75.9 mg/dL (ref >39.00)
Total CHOL/HDL Ratio: 3
Triglycerides: 120 mg/dL (ref 0.0–149.0)
VLDL: 24 mg/dL (ref 0.0–40.0)

## 2010-11-09 LAB — PSA: PSA: 0.71 ng/mL (ref 0.10–4.00)

## 2010-11-09 LAB — CBC WITH DIFFERENTIAL/PLATELET
Basophils Absolute: 0 10*3/uL (ref 0.0–0.1)
Basophils Relative: 0.3 % (ref 0.0–3.0)
Eosinophils Absolute: 0 10*3/uL (ref 0.0–0.7)
Eosinophils Relative: 0.3 % (ref 0.0–5.0)
HCT: 42.1 % (ref 39.0–52.0)
Hemoglobin: 14.7 g/dL (ref 13.0–17.0)
Lymphocytes Relative: 21.1 % (ref 12.0–46.0)
Lymphs Abs: 1.7 10*3/uL (ref 0.7–4.0)
MCHC: 34.8 g/dL (ref 30.0–36.0)
MCV: 94.8 fl (ref 78.0–100.0)
Monocytes Absolute: 0.5 10*3/uL (ref 0.1–1.0)
Monocytes Relative: 6.7 % (ref 3.0–12.0)
Neutro Abs: 5.6 10*3/uL (ref 1.4–7.7)
Neutrophils Relative %: 71.6 % (ref 43.0–77.0)
Platelets: 213 10*3/uL (ref 150.0–400.0)
RBC: 4.44 Mil/uL (ref 4.22–5.81)
RDW: 13.4 % (ref 11.5–14.6)
WBC: 7.9 10*3/uL (ref 4.5–10.5)

## 2010-11-09 LAB — HEPATIC FUNCTION PANEL
ALT: 21 U/L (ref 0–53)
AST: 24 U/L (ref 0–37)
Albumin: 4.4 g/dL (ref 3.5–5.2)
Alkaline Phosphatase: 43 U/L (ref 39–117)
Bilirubin, Direct: 0.1 mg/dL (ref 0.0–0.3)
Total Bilirubin: 0.7 mg/dL (ref 0.3–1.2)
Total Protein: 7 g/dL (ref 6.0–8.3)

## 2010-11-09 LAB — BASIC METABOLIC PANEL
BUN: 23 mg/dL (ref 6–23)
CO2: 30 mEq/L (ref 19–32)
Calcium: 9.8 mg/dL (ref 8.4–10.5)
Chloride: 104 mEq/L (ref 96–112)
Creatinine, Ser: 1.6 mg/dL — ABNORMAL HIGH (ref 0.4–1.5)
GFR: 49.69 mL/min — ABNORMAL LOW (ref >60.00)
Glucose, Bld: 83 mg/dL (ref 70–99)
Potassium: 4.1 mEq/L (ref 3.5–5.1)
Sodium: 140 mEq/L (ref 135–145)

## 2010-11-09 LAB — TSH: TSH: 1.3 u[IU]/mL (ref 0.35–5.50)

## 2010-11-09 MED ORDER — ACYCLOVIR 400 MG PO TABS: ORAL_TABLET | ORAL | Status: DC

## 2010-11-09 MED ORDER — SIMVASTATIN 20 MG PO TABS: 20.0000 mg | ORAL_TABLET | Freq: Every day | ORAL | Status: DC

## 2010-11-09 NOTE — Progress Notes (Signed)
  Subjective:    Patient ID: Mason Irwin, male    DOB: 1956-03-11, 55 y.o.   MRN: [id number]  HPI Mason Irwin is a 55 y/o male married, nonsmoker, who comes in today for general physical examination because of a history of hyperlipidemia.  He takes Zocor 20 mg nightly Will check labs today.  Also, advised to start taking 81 mg baby aspirin.  He takes Valtrex p.r.n. For outbreak of HSV.  He gets routine eye care, dental care, tetanus up-to-date, do colonoscopy   Review of Systems  Constitutional: Negative.   HENT: Negative.   Eyes: Negative.   Respiratory: Negative.   Cardiovascular: Negative.   Gastrointestinal: Negative.   Genitourinary: Negative.   Musculoskeletal: Negative.   Skin: Negative.   Neurological: Negative.   Hematological: Negative.   Psychiatric/Behavioral: Negative.        Objective:   Physical Exam  Constitutional: He is oriented to person, place, and time. He appears well-developed and well-nourished.  HENT:  Head: Normocephalic and atraumatic.  Right Ear: External ear normal.  Left Ear: External ear normal.  Nose: Nose normal.  Mouth/Throat: Oropharynx is clear and moist.  Eyes: Conjunctivae and EOM are normal. Pupils are equal, round, and reactive to light.  Neck: Normal range of motion. Neck supple. No JVD present. No tracheal deviation present. No thyromegaly present.  Cardiovascular: Normal rate, regular rhythm, normal heart sounds and intact distal pulses.  Exam reveals no gallop and no friction rub.   No murmur heard. Pulmonary/Chest: Effort normal and breath sounds normal. No stridor. No respiratory distress. He has no wheezes. He has no rales. He exhibits no tenderness.  Abdominal: Soft. Bowel sounds are normal. He exhibits no distension and no mass. There is no tenderness. There is no rebound and no guarding.  Genitourinary: Rectum normal, prostate normal and penis normal. Guaiac negative stool. No penile tenderness.  Musculoskeletal: Normal  range of motion. He exhibits no edema and no tenderness.  Lymphadenopathy:    He has no cervical adenopathy.  Neurological: He is alert and oriented to person, place, and time. He has normal reflexes. No cranial nerve deficit. He exhibits normal muscle tone.  Skin: Skin is warm and dry. No rash noted. No erythema. No pallor.  Psychiatric: He has a normal mood and affect. His behavior is normal. Judgment and thought content normal.          Assessment & Plan:  Healthy male.  Hyperlipidemia.  Continue Zocor and aspirin.  History of HSV.  Ultrex p.r.n.  Refer for screening colonoscopy

## 2010-11-09 NOTE — Patient Instructions (Signed)
Continue the Zocor, 20 mg daily, along with a baby aspirin.  Acyclovir 400 mg two tabs twice daily as needed.  Return one year or sooner if any problems.  We will call you the report of your lab work within two weeks

## 2010-11-10 LAB — LDL CHOLESTEROL, DIRECT: Direct LDL: 145.1 mg/dL

## 2010-11-15 NOTE — Progress Notes (Signed)
patient  Is aware and lab appointment made 

## 2010-11-20 ENCOUNTER — Encounter: Payer: Self-pay | Admitting: Gastroenterology

## 2010-11-28 ENCOUNTER — Other Ambulatory Visit (INDEPENDENT_AMBULATORY_CARE_PROVIDER_SITE_OTHER): Payer: BC Managed Care – PPO

## 2010-11-28 DIAGNOSIS — I10 Essential (primary) hypertension: Secondary | ICD-10-CM

## 2010-11-28 LAB — BASIC METABOLIC PANEL
BUN: 16 mg/dL (ref 6–23)
CO2: 29 mEq/L (ref 19–32)
Calcium: 9.5 mg/dL (ref 8.4–10.5)
Chloride: 104 mEq/L (ref 96–112)
Creatinine, Ser: 1.1 mg/dL (ref 0.4–1.5)
GFR: 77.6 mL/min (ref >60.00)
Glucose, Bld: 74 mg/dL (ref 70–99)
Potassium: 4.2 mEq/L (ref 3.5–5.1)
Sodium: 140 mEq/L (ref 135–145)

## 2010-12-12 ENCOUNTER — Telehealth: Payer: Self-pay | Admitting: Family Medicine

## 2010-12-12 NOTE — Telephone Encounter (Signed)
Requesting lab results

## 2010-12-14 NOTE — Telephone Encounter (Signed)
Electrolytes all back to normal.  Please send him a copy for his records

## 2010-12-15 NOTE — Telephone Encounter (Signed)
patient  Is aware of labs and copy mailed

## 2010-12-19 ENCOUNTER — Ambulatory Visit (AMBULATORY_SURGERY_CENTER): Payer: BC Managed Care – PPO

## 2010-12-19 VITALS — Ht 71.0 in | Wt 191.2 lb

## 2010-12-19 DIAGNOSIS — Z1211 Encounter for screening for malignant neoplasm of colon: Secondary | ICD-10-CM

## 2010-12-19 MED ORDER — PEG-KCL-NACL-NASULF-NA ASC-C 100 G PO SOLR: 1.0000 | Freq: Once | ORAL | Status: AC

## 2010-12-20 ENCOUNTER — Telehealth: Payer: Self-pay | Admitting: Family Medicine

## 2010-12-20 NOTE — Telephone Encounter (Signed)
Pt went to [facility] for a consult on yesterday. They told him there that he did not have a referral. However, his endoscopy is scheduled for 01-08-2011. Please advise on both appts.

## 2010-12-21 NOTE — Telephone Encounter (Signed)
Spoke with patient.

## 2011-01-02 ENCOUNTER — Other Ambulatory Visit: Payer: BC Managed Care – PPO | Admitting: Gastroenterology

## 2011-01-08 ENCOUNTER — Ambulatory Visit (AMBULATORY_SURGERY_CENTER): Payer: BC Managed Care – PPO | Admitting: Gastroenterology

## 2011-01-08 ENCOUNTER — Encounter: Payer: Self-pay | Admitting: Gastroenterology

## 2011-01-08 VITALS — BP 135/89 | HR 68 | Temp 97.7°F | Resp 16 | Ht 71.0 in | Wt 195.0 lb

## 2011-01-08 DIAGNOSIS — K648 Other hemorrhoids: Secondary | ICD-10-CM

## 2011-01-08 DIAGNOSIS — K625 Hemorrhage of anus and rectum: Secondary | ICD-10-CM

## 2011-01-08 DIAGNOSIS — D126 Benign neoplasm of colon, unspecified: Secondary | ICD-10-CM

## 2011-01-08 DIAGNOSIS — Z1211 Encounter for screening for malignant neoplasm of colon: Secondary | ICD-10-CM

## 2011-01-08 MED ORDER — SODIUM CHLORIDE 0.9 % IV SOLN: 500.0000 mL | INTRAVENOUS | Status: DC

## 2011-01-08 NOTE — Patient Instructions (Signed)
Please review discharge instructions (blue and green sheets)  Per Dr. Christella Hartigan, please start daily OTC fiber supplement (such as orange flavored citrucel powder) to try to prevent straining, pushing to have a BM.  This will usually cause hemorrhoids to resolve

## 2011-01-09 ENCOUNTER — Telehealth: Payer: Self-pay

## 2011-01-09 NOTE — Telephone Encounter (Signed)
Follow up Call- Patient questions:  Do you have a fever, pain , or abdominal swelling? no Pain Score  0 *  Have you tolerated food without any problems? yes  Have you been able to return to your normal activities? yes  Do you have any questions about your discharge instructions: Diet   no Medications  no Follow up visit  no  Do you have questions or concerns about your Care? no  Actions: * If pain score is 4 or above: No action needed, pain <4.  The pt asked if he would rec the path results.  I advised him he would receive a letter within 1-2 weeks with the results.  To call us if he has not heard from [place] in 3 weeks.  MAW

## 2011-04-25 ENCOUNTER — Ambulatory Visit: Payer: BC Managed Care – PPO | Admitting: Family Medicine

## 2011-05-15 ENCOUNTER — Ambulatory Visit: Payer: BC Managed Care – PPO | Admitting: Family Medicine

## 2011-07-02 ENCOUNTER — Other Ambulatory Visit (INDEPENDENT_AMBULATORY_CARE_PROVIDER_SITE_OTHER): Payer: BC Managed Care – PPO

## 2011-07-02 DIAGNOSIS — Z0000 Encounter for general adult medical examination without abnormal findings: Secondary | ICD-10-CM

## 2011-07-02 LAB — LIPID PANEL
Cholesterol: 204 mg/dL — ABNORMAL HIGH (ref 0–200)
HDL: 77.5 mg/dL (ref >39.00)
Total CHOL/HDL Ratio: 3
Triglycerides: 25 mg/dL (ref 0.0–149.0)
VLDL: 5 mg/dL (ref 0.0–40.0)

## 2011-07-02 LAB — CBC WITH DIFFERENTIAL/PLATELET
Basophils Absolute: 0 10*3/uL (ref 0.0–0.1)
Basophils Relative: 0.8 % (ref 0.0–3.0)
Eosinophils Absolute: 0.1 10*3/uL (ref 0.0–0.7)
Eosinophils Relative: 1.6 % (ref 0.0–5.0)
HCT: 42.9 % (ref 39.0–52.0)
Hemoglobin: 14.7 g/dL (ref 13.0–17.0)
Lymphocytes Relative: 38.6 % (ref 12.0–46.0)
Lymphs Abs: 1.5 10*3/uL (ref 0.7–4.0)
MCHC: 34.3 g/dL (ref 30.0–36.0)
MCV: 96.1 fl (ref 78.0–100.0)
Monocytes Absolute: 0.4 10*3/uL (ref 0.1–1.0)
Monocytes Relative: 11.5 % (ref 3.0–12.0)
Neutro Abs: 1.8 10*3/uL (ref 1.4–7.7)
Neutrophils Relative %: 47.5 % (ref 43.0–77.0)
Platelets: 210 10*3/uL (ref 150.0–400.0)
RBC: 4.46 Mil/uL (ref 4.22–5.81)
RDW: 13.6 % (ref 11.5–14.6)
WBC: 3.9 10*3/uL — ABNORMAL LOW (ref 4.5–10.5)

## 2011-07-02 LAB — HEPATIC FUNCTION PANEL
ALT: 33 U/L (ref 0–53)
AST: 37 U/L (ref 0–37)
Albumin: 4.2 g/dL (ref 3.5–5.2)
Alkaline Phosphatase: 44 U/L (ref 39–117)
Bilirubin, Direct: 0.1 mg/dL (ref 0.0–0.3)
Total Bilirubin: 0.7 mg/dL (ref 0.3–1.2)
Total Protein: 6.5 g/dL (ref 6.0–8.3)

## 2011-07-02 LAB — POCT URINALYSIS DIPSTICK
Bilirubin, UA: NEGATIVE
Blood, UA: NEGATIVE
Glucose, UA: NEGATIVE
Ketones, UA: NEGATIVE
Leukocytes, UA: NEGATIVE
Nitrite, UA: NEGATIVE
Protein, UA: NEGATIVE
Spec Grav, UA: >=1.03
Urobilinogen, UA: 0.2
pH, UA: 5.5

## 2011-07-02 LAB — BASIC METABOLIC PANEL
BUN: 24 mg/dL — ABNORMAL HIGH (ref 6–23)
CO2: 27 mEq/L (ref 19–32)
Calcium: 9.2 mg/dL (ref 8.4–10.5)
Chloride: 103 mEq/L (ref 96–112)
Creatinine, Ser: 0.9 mg/dL (ref 0.4–1.5)
GFR: 94.74 mL/min (ref >60.00)
Glucose, Bld: 88 mg/dL (ref 70–99)
Potassium: 4.5 mEq/L (ref 3.5–5.1)
Sodium: 139 mEq/L (ref 135–145)

## 2011-07-02 LAB — PSA: PSA: 0.68 ng/mL (ref 0.10–4.00)

## 2011-07-02 LAB — LDL CHOLESTEROL, DIRECT: Direct LDL: 109 mg/dL

## 2011-07-02 LAB — TSH: TSH: 1.54 u[IU]/mL (ref 0.35–5.50)

## 2011-07-09 ENCOUNTER — Encounter: Payer: Self-pay | Admitting: Family Medicine

## 2011-07-09 ENCOUNTER — Ambulatory Visit (INDEPENDENT_AMBULATORY_CARE_PROVIDER_SITE_OTHER): Payer: BC Managed Care – PPO | Admitting: Family Medicine

## 2011-07-09 DIAGNOSIS — L259 Unspecified contact dermatitis, unspecified cause: Secondary | ICD-10-CM

## 2011-07-09 DIAGNOSIS — B009 Herpesviral infection, unspecified: Secondary | ICD-10-CM

## 2011-07-09 DIAGNOSIS — E782 Mixed hyperlipidemia: Secondary | ICD-10-CM

## 2011-07-10 ENCOUNTER — Encounter: Payer: Self-pay | Admitting: Family Medicine

## 2011-07-10 DIAGNOSIS — B009 Herpesviral infection, unspecified: Secondary | ICD-10-CM | POA: Insufficient documentation

## 2011-07-10 MED ORDER — ACYCLOVIR 400 MG PO TABS: 400.0000 mg | ORAL_TABLET | Freq: Every day | ORAL | Status: DC

## 2011-07-10 MED ORDER — PREDNISONE 20 MG PO TABS: 10.0000 mg | ORAL_TABLET | ORAL | Status: DC | PRN

## 2011-07-10 NOTE — Patient Instructions (Signed)
Continue your current good health habits  Followup in 1 year sooner if any problems

## 2011-07-10 NOTE — Progress Notes (Signed)
  Subjective:    Patient ID: Mason Irwin, male    DOB: Oct 20, 1955, 56 y.o.   MRN: [id number]  HPI richie   is a 56 year old married male nonsmoker who comes in today for general physical examination  He has a history of recurrent HSV and takes acyclovir when necessary  He has a history of severe eczema he is involving his hands and his feet. He uses over-the-counter steroid cream and occasionally prednisone by mouth  He has a history of hyperlipidemia and was on Zocor 20 mg daily. He decided to stop the medication and try diet and exercise. He's normalized his lipids now with diet and exercise  He's had bilateral inguinal hernia repairs.   Review of Systems  Constitutional: Negative.   HENT: Negative.   Eyes: Negative.   Respiratory: Negative.   Cardiovascular: Negative.   Gastrointestinal: Negative.   Genitourinary: Negative.   Musculoskeletal: Negative.   Skin: Negative.   Neurological: Negative.   Hematological: Negative.   Psychiatric/Behavioral: Negative.        Objective:   Physical Exam  Constitutional: He is oriented to person, place, and time. He appears well-developed and well-nourished.  HENT:  Head: Normocephalic and atraumatic.  Right Ear: External ear normal.  Left Ear: External ear normal.  Nose: Nose normal.  Mouth/Throat: Oropharynx is clear and moist.  Eyes: Conjunctivae and EOM are normal. Pupils are equal, round, and reactive to light.  Neck: Normal range of motion. Neck supple. No JVD present. No tracheal deviation present. No thyromegaly present.  Cardiovascular: Normal rate, regular rhythm, normal heart sounds and intact distal pulses.  Exam reveals no gallop and no friction rub.   No murmur heard. Pulmonary/Chest: Effort normal and breath sounds normal. No stridor. No respiratory distress. He has no wheezes. He has no rales. He exhibits no tenderness.  Abdominal: Soft. Bowel sounds are normal. He exhibits no distension and no mass. There is no  tenderness. There is no rebound and no guarding.  Genitourinary: Rectum normal, prostate normal and penis normal. Guaiac negative stool. No penile tenderness.  Musculoskeletal: Normal range of motion. He exhibits no edema and no tenderness.  Lymphadenopathy:    He has no cervical adenopathy.  Neurological: He is alert and oriented to person, place, and time. He has normal reflexes. No cranial nerve deficit. He exhibits normal muscle tone.  Skin: Skin is warm and dry. No rash noted. No erythema. No pallor.  Psychiatric: He has a normal mood and affect. His behavior is normal. Judgment and thought content normal.          Assessment & Plan:  Healthy male  History of recurrent HSV acyclovir when necessary  Eczema continue OTC medications and prednisone when necessary  Hyperlipidemia controlled with diet and exercise continue  Followup in 1 year sooner if any problems

## 2011-10-04 ENCOUNTER — Ambulatory Visit (INDEPENDENT_AMBULATORY_CARE_PROVIDER_SITE_OTHER): Payer: BC Managed Care – PPO | Admitting: Sports Medicine

## 2011-10-04 VITALS — BP 124/83 | Ht 70.0 in | Wt 195.0 lb

## 2011-10-04 DIAGNOSIS — M545 Low back pain, unspecified: Secondary | ICD-10-CM

## 2011-10-04 NOTE — Progress Notes (Signed)
  Subjective:    Patient ID: Mason Irwin, male    DOB: 03-17-1956, 56 y.o.   MRN: [id number]  HPI  15 years ago had an arthroscopic disk - L4/5 ? 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  LBP started in 20s  Now gets back pain after yard work and may have to sleep in [employer] bothers back Does this every other week  Yoga - cannot sit up straight from floor This causes a lot of back pain Does 2 to 3x wk  Morning stiffness No sciatic now  Now cycles - goes hard for 90 mins 3 to 4 x week motorcyles as well in bent position  Work - computer with sitting - not painful  Pst HX Hand eczema - this requires some prednisone Did not notice change in back sxs   Review of Systems Inguinal hernias at birth and had repairs Had problems with stiicches afterwards for years    Objective:   Physical Exam  NAD Back exam: 90 degrees flexion 10 deg extension 15 deg rt lat bend, 20-25 lt lat bend Lt rotation 25 deg, rt rotation 30 Able to walk heel to on outside of feet w/out difficulties Able to balance on with eyes closed on both feet Straight leg - 70 deg lt, 75 deg rt  Hip exam: FABER normal bilat 75 deg rotation rt hip, 60 deg lt hip rotation  SI joints move equally Leg lengths equal Gets almost not back extension on upward dog Does not get good abdominal firing on straight leg lift Excellent eversion and inversion strength of feet Knee to chest good bilat      Assessment & Plan:

## 2011-10-04 NOTE — Assessment & Plan Note (Signed)
Patient is fit and has learned to compensate for his periodic low back pain  I gave him a series of exercises to work on back extension which is fairly limited  He will keep up 2 or 3 flexion exercises as well  I strongly encouraged him to continue yoga. However I do think he will be unable to do certain straight back positions because of the congenital changes in his lower lumbar spine  He will work on his exercise program and I will see him when necessary

## 2011-10-04 NOTE — Patient Instructions (Signed)
Yoga is great for your back  Work on back extension- upward dog is good to help this  Do suggested back exercises 3-4 times per week Stretches- hold for 5 breaths and repeat 5 times  Do standing one leg cone touch  Do not push yourself past mild pain  Please follow up as needed  Thank you for seeing [place] today!

## 2012-03-26 ENCOUNTER — Ambulatory Visit (INDEPENDENT_AMBULATORY_CARE_PROVIDER_SITE_OTHER): Payer: BC Managed Care – PPO

## 2012-03-26 DIAGNOSIS — Z23 Encounter for immunization: Secondary | ICD-10-CM

## 2012-09-04 ENCOUNTER — Other Ambulatory Visit (INDEPENDENT_AMBULATORY_CARE_PROVIDER_SITE_OTHER): Payer: BC Managed Care – PPO

## 2012-09-04 DIAGNOSIS — Z0000 Encounter for general adult medical examination without abnormal findings: Secondary | ICD-10-CM

## 2012-09-04 LAB — CBC WITH DIFFERENTIAL/PLATELET
Basophils Absolute: 0 10*3/uL (ref 0.0–0.1)
Basophils Relative: 0.8 % (ref 0.0–3.0)
Eosinophils Absolute: 0.1 10*3/uL (ref 0.0–0.7)
Eosinophils Relative: 1.4 % (ref 0.0–5.0)
HCT: 45.5 % (ref 39.0–52.0)
Hemoglobin: 15.6 g/dL (ref 13.0–17.0)
Lymphocytes Relative: 28.5 % (ref 12.0–46.0)
Lymphs Abs: 1.4 10*3/uL (ref 0.7–4.0)
MCHC: 34.4 g/dL (ref 30.0–36.0)
MCV: 93.6 fl (ref 78.0–100.0)
Monocytes Absolute: 0.4 10*3/uL (ref 0.1–1.0)
Monocytes Relative: 9.2 % (ref 3.0–12.0)
Neutro Abs: 2.9 10*3/uL (ref 1.4–7.7)
Neutrophils Relative %: 60.1 % (ref 43.0–77.0)
Platelets: 220 10*3/uL (ref 150.0–400.0)
RBC: 4.86 Mil/uL (ref 4.22–5.81)
RDW: 13.3 % (ref 11.5–14.6)
WBC: 4.8 10*3/uL (ref 4.5–10.5)

## 2012-09-04 LAB — PSA: PSA: 0.81 ng/mL (ref 0.10–4.00)

## 2012-09-04 LAB — BASIC METABOLIC PANEL
BUN: 17 mg/dL (ref 6–23)
CO2: 29 mEq/L (ref 19–32)
Calcium: 9.6 mg/dL (ref 8.4–10.5)
Chloride: 101 mEq/L (ref 96–112)
Creatinine, Ser: 1.1 mg/dL (ref 0.4–1.5)
GFR: 77.09 mL/min (ref >60.00)
Glucose, Bld: 84 mg/dL (ref 70–99)
Potassium: 4.4 mEq/L (ref 3.5–5.1)
Sodium: 136 mEq/L (ref 135–145)

## 2012-09-04 LAB — HEPATIC FUNCTION PANEL
ALT: 21 U/L (ref 0–53)
AST: 23 U/L (ref 0–37)
Albumin: 4.2 g/dL (ref 3.5–5.2)
Alkaline Phosphatase: 48 U/L (ref 39–117)
Bilirubin, Direct: 0.1 mg/dL (ref 0.0–0.3)
Total Bilirubin: 0.8 mg/dL (ref 0.3–1.2)
Total Protein: 6.9 g/dL (ref 6.0–8.3)

## 2012-09-04 LAB — POCT URINALYSIS DIPSTICK
Bilirubin, UA: NEGATIVE
Blood, UA: NEGATIVE
Glucose, UA: NEGATIVE
Ketones, UA: NEGATIVE
Leukocytes, UA: NEGATIVE
Nitrite, UA: NEGATIVE
Protein, UA: NEGATIVE
Spec Grav, UA: 1.015
Urobilinogen, UA: 0.2
pH, UA: 7

## 2012-09-04 LAB — LIPID PANEL
Cholesterol: 246 mg/dL — ABNORMAL HIGH (ref 0–200)
HDL: 74.3 mg/dL (ref >39.00)
Total CHOL/HDL Ratio: 3
Triglycerides: 78 mg/dL (ref 0.0–149.0)
VLDL: 15.6 mg/dL (ref 0.0–40.0)

## 2012-09-04 LAB — TSH: TSH: 1.43 u[IU]/mL (ref 0.35–5.50)

## 2012-09-04 LAB — LDL CHOLESTEROL, DIRECT: Direct LDL: 150.5 mg/dL

## 2012-09-11 ENCOUNTER — Encounter: Payer: BC Managed Care – PPO | Admitting: Family Medicine

## 2012-09-22 ENCOUNTER — Ambulatory Visit (INDEPENDENT_AMBULATORY_CARE_PROVIDER_SITE_OTHER): Payer: BC Managed Care – PPO | Admitting: Family Medicine

## 2012-09-22 ENCOUNTER — Encounter: Payer: Self-pay | Admitting: Family Medicine

## 2012-09-22 VITALS — BP 120/84 | Temp 98.0°F | Ht 70.25 in | Wt 189.0 lb

## 2012-09-22 DIAGNOSIS — L259 Unspecified contact dermatitis, unspecified cause: Secondary | ICD-10-CM

## 2012-09-22 DIAGNOSIS — Z0000 Encounter for general adult medical examination without abnormal findings: Secondary | ICD-10-CM

## 2012-09-22 DIAGNOSIS — B009 Herpesviral infection, unspecified: Secondary | ICD-10-CM

## 2012-09-22 MED ORDER — ACYCLOVIR 400 MG PO TABS: 400.0000 mg | ORAL_TABLET | Freq: Every day | ORAL | Status: DC

## 2012-09-22 NOTE — Progress Notes (Signed)
  Subjective:    Patient ID: Mason Irwin, male    DOB: 16-Jan-1956, 57 y.o.   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 He rides his bike around an hour at 2-1/2 daily. He is in excellent physical shape. He exercises also helps control his hyperlipidemia.  He gets routine eye care, dental care, recent colonoscopy showed one polyp he is due for followup, vaccinations up-to-date   Review of Systems  Constitutional: Negative.   HENT: Negative.   Eyes: Negative.   Respiratory: Negative.   Cardiovascular: Negative.   Gastrointestinal: Negative.   Genitourinary: Negative.   Musculoskeletal: Negative.   Skin: Negative.   Neurological: Negative.   Psychiatric/Behavioral: Negative.        Objective:   Physical Exam  Constitutional: He is oriented to person, place, and time. He appears well-developed and well-nourished.  HENT:  Head: Normocephalic and atraumatic.  Right Ear: External ear normal.  Left Ear: External ear normal.  Nose: Nose normal.  Mouth/Throat: Oropharynx is clear and moist.  Eyes: Conjunctivae and EOM are normal. Pupils are equal, round, and reactive to light.  Neck: Normal range of motion. Neck supple. No JVD present. No tracheal deviation present. No thyromegaly present.  Cardiovascular: Normal rate, regular rhythm, normal heart sounds and intact distal pulses.  Exam reveals no gallop and no friction rub.   No murmur heard. Pulmonary/Chest: Effort normal and breath sounds normal. No stridor. No respiratory distress. He has no wheezes. He has no rales. He exhibits no tenderness.  Abdominal: Soft. Bowel sounds are normal. He exhibits no distension and no mass. There is no tenderness. There is no rebound and no  guarding.  Genitourinary: Rectum normal, prostate normal and penis normal. Guaiac negative stool. No penile tenderness.  Musculoskeletal: Normal range of motion. He exhibits no edema and no tenderness.  Lymphadenopathy:    He has no cervical adenopathy.  Neurological: He is alert and oriented to person, place, and time. He has normal reflexes. No cranial nerve deficit. He exhibits normal muscle tone.  Skin: Skin is warm and dry. No rash noted. No erythema. No pallor.  Total body skin exam normal  Psychiatric: He has a normal mood and affect. His behavior is normal. Judgment and thought content normal.          Assessment & Plan:  Healthy male  History of occasional HSV 1 acyclovir when necessary  Allergic rhinitis Zyrtec when necessary

## 2012-09-22 NOTE — Patient Instructions (Signed)
Continue your current good exercise and health habits  Take an aspirin tablet daily  Return in one year sooner if any problems

## 2013-04-03 ENCOUNTER — Ambulatory Visit (INDEPENDENT_AMBULATORY_CARE_PROVIDER_SITE_OTHER): Payer: BC Managed Care – PPO

## 2013-04-03 DIAGNOSIS — Z23 Encounter for immunization: Secondary | ICD-10-CM

## 2013-09-22 ENCOUNTER — Other Ambulatory Visit (INDEPENDENT_AMBULATORY_CARE_PROVIDER_SITE_OTHER): Payer: BC Managed Care – PPO

## 2013-09-22 DIAGNOSIS — Z0000 Encounter for general adult medical examination without abnormal findings: Secondary | ICD-10-CM

## 2013-09-22 LAB — HEPATIC FUNCTION PANEL
ALT: 22 U/L (ref 0–53)
AST: 23 U/L (ref 0–37)
Albumin: 4.1 g/dL (ref 3.5–5.2)
Alkaline Phosphatase: 37 U/L — ABNORMAL LOW (ref 39–117)
Bilirubin, Direct: 0.1 mg/dL (ref 0.0–0.3)
Total Bilirubin: 0.7 mg/dL (ref 0.3–1.2)
Total Protein: 6.7 g/dL (ref 6.0–8.3)

## 2013-09-22 LAB — BASIC METABOLIC PANEL
BUN: 18 mg/dL (ref 6–23)
CO2: 29 mEq/L (ref 19–32)
Calcium: 9.8 mg/dL (ref 8.4–10.5)
Chloride: 104 mEq/L (ref 96–112)
Creatinine, Ser: 1 mg/dL (ref 0.4–1.5)
GFR: 84.07 mL/min (ref >60.00)
Glucose, Bld: 87 mg/dL (ref 70–99)
Potassium: 4.1 mEq/L (ref 3.5–5.1)
Sodium: 139 mEq/L (ref 135–145)

## 2013-09-22 LAB — POCT URINALYSIS DIPSTICK
Bilirubin, UA: NEGATIVE
Blood, UA: NEGATIVE
Glucose, UA: NEGATIVE
Ketones, UA: NEGATIVE
Leukocytes, UA: NEGATIVE
Nitrite, UA: NEGATIVE
Protein, UA: NEGATIVE
Spec Grav, UA: 1.01
Urobilinogen, UA: 0.2
pH, UA: 7

## 2013-09-22 LAB — PSA: PSA: 0.83 ng/mL (ref 0.10–4.00)

## 2013-09-22 LAB — CBC WITH DIFFERENTIAL/PLATELET
Basophils Absolute: 0 10*3/uL (ref 0.0–0.1)
Basophils Relative: 0.6 % (ref 0.0–3.0)
Eosinophils Absolute: 0.2 10*3/uL (ref 0.0–0.7)
Eosinophils Relative: 3.5 % (ref 0.0–5.0)
HCT: 44 % (ref 39.0–52.0)
Hemoglobin: 15 g/dL (ref 13.0–17.0)
Lymphocytes Relative: 29.1 % (ref 12.0–46.0)
Lymphs Abs: 1.5 10*3/uL (ref 0.7–4.0)
MCHC: 34.1 g/dL (ref 30.0–36.0)
MCV: 95.3 fl (ref 78.0–100.0)
Monocytes Absolute: 0.5 10*3/uL (ref 0.1–1.0)
Monocytes Relative: 9.5 % (ref 3.0–12.0)
Neutro Abs: 3 10*3/uL (ref 1.4–7.7)
Neutrophils Relative %: 57.3 % (ref 43.0–77.0)
Platelets: 205 10*3/uL (ref 150.0–400.0)
RBC: 4.61 Mil/uL (ref 4.22–5.81)
RDW: 13.4 % (ref 11.5–14.6)
WBC: 5.2 10*3/uL (ref 4.5–10.5)

## 2013-09-22 LAB — LIPID PANEL
Cholesterol: 220 mg/dL — ABNORMAL HIGH (ref 0–200)
HDL: 71.1 mg/dL (ref >39.00)
LDL Cholesterol: 137 mg/dL — ABNORMAL HIGH (ref 0–99)
Total CHOL/HDL Ratio: 3
Triglycerides: 62 mg/dL (ref 0.0–149.0)
VLDL: 12.4 mg/dL (ref 0.0–40.0)

## 2013-09-22 LAB — TSH: TSH: 2.06 u[IU]/mL (ref 0.35–5.50)

## 2013-09-29 ENCOUNTER — Encounter: Payer: Self-pay | Admitting: Family Medicine

## 2013-09-29 ENCOUNTER — Encounter: Payer: BC Managed Care – PPO | Admitting: Family Medicine

## 2013-09-29 ENCOUNTER — Ambulatory Visit (INDEPENDENT_AMBULATORY_CARE_PROVIDER_SITE_OTHER): Payer: BC Managed Care – PPO | Admitting: Family Medicine

## 2013-09-29 VITALS — BP 120/80 | Temp 98.7°F | Ht 70.25 in | Wt 186.0 lb

## 2013-09-29 DIAGNOSIS — Z0000 Encounter for general adult medical examination without abnormal findings: Secondary | ICD-10-CM

## 2013-09-29 NOTE — Progress Notes (Signed)
   Subjective:    Patient ID: Mason Irwin, male    DOB: 04-30-1956, 58 y.o.   MRN: [id number]  HPI [name] is a 58 year old married male nonsmoker who comes in today for general physical examination  He takes OTC Zyrtec 10 mg each bedtime for allergic rhinitis  He says had a good year no problems up to 7 pain in his right shoulder. No history of trauma. He's physically reactive he works out daily.  He gets routine eye care, dental care, colonoscopy in his early 47s showed one polyp he was told to come back in 10 years  Vaccinations up-to-date   Review of Systems  Constitutional: Negative.   HENT: Negative.   Eyes: Negative.   Respiratory: Negative.   Cardiovascular: Negative.   Gastrointestinal: Negative.   Genitourinary: Negative.   Musculoskeletal: Negative.   Skin: Negative.   Neurological: Negative.   Psychiatric/Behavioral: Negative.    Father had prostate cancer in his 21s    Objective:   Physical Exam  Nursing note and vitals reviewed. Constitutional: He is oriented to person, place, and time. He appears well-developed and well-nourished.  HENT:  Head: Normocephalic and atraumatic.  Right Ear: External ear normal.  Left Ear: External ear normal.  Nose: Nose normal.  Mouth/Throat: Oropharynx is clear and moist.  Eyes: Conjunctivae and EOM are normal. Pupils are equal, round, and reactive to light.  Neck: Normal range of motion. Neck supple. No JVD present. No tracheal deviation present. No thyromegaly present.  Cardiovascular: Normal rate, regular rhythm, normal heart sounds and intact distal pulses.  Exam reveals no gallop and no friction rub.   No murmur heard. Pulmonary/Chest: Effort normal and breath sounds normal. No stridor. No respiratory distress. He has no wheezes. He has no rales. He exhibits no tenderness.  Abdominal: Soft. Bowel sounds are normal. He exhibits no distension and no mass. There is no tenderness. There is no rebound and no guarding.    Genitourinary: Rectum normal, prostate normal and penis normal. Guaiac negative stool. No penile tenderness.  Musculoskeletal: Normal range of motion. He exhibits no edema and no tenderness.  Lymphadenopathy:    He has no cervical adenopathy.  Neurological: He is alert and oriented to person, place, and time. He has normal reflexes. No cranial nerve deficit. He exhibits normal muscle tone.  Skin: Skin is warm and dry. No rash noted. No erythema. No pallor.  Total body skin exam normal  Psychiatric: He has a normal mood and affect. His behavior is normal. Judgment and thought content normal.          Assessment & Plan:  Healthy male  Allergic rhinitis continue Zyrtec  Family history prostate cancer monitor carefully

## 2013-09-29 NOTE — Progress Notes (Signed)
Pre visit review using our clinic review tool, if applicable. No additional management support is needed unless otherwise documented below in the visit note. 

## 2013-09-29 NOTE — Patient Instructions (Signed)
Continue your good health habits  Return in one year for general physical examination sooner if any problems  Dr. Rodena Goldmann

## 2015-12-19 ENCOUNTER — Ambulatory Visit: Payer: Self-pay | Admitting: Family Medicine

## 2016-08-29 ENCOUNTER — Encounter (HOSPITAL_COMMUNITY): Payer: Self-pay | Admitting: *Deleted

## 2016-08-29 ENCOUNTER — Ambulatory Visit (HOSPITAL_COMMUNITY)
Admission: EM | Admit: 2016-08-29 | Discharge: 2016-08-29 | Disposition: A | Discharge status: Home / Self Care | Payer: BLUE CROSS/BLUE SHIELD | Attending: Emergency Medicine | Admitting: Emergency Medicine

## 2016-08-29 ENCOUNTER — Ambulatory Visit (INDEPENDENT_AMBULATORY_CARE_PROVIDER_SITE_OTHER): Payer: BLUE CROSS/BLUE SHIELD

## 2016-08-29 DIAGNOSIS — S92351A Displaced fracture of fifth metatarsal bone, right foot, initial encounter for closed fracture: Secondary | ICD-10-CM | POA: Diagnosis not present

## 2016-08-29 MED ORDER — HYDROCODONE-ACETAMINOPHEN 5-325 MG PO TABS: 1.0000 | ORAL_TABLET | Freq: Four times a day (QID) | ORAL | 0 refills | Status: DC | PRN

## 2016-08-29 MED ORDER — HYDROCODONE-ACETAMINOPHEN 5-325 MG PO TABS
ORAL_TABLET | ORAL | Status: AC
  Filled 2016-08-29: qty 1

## 2016-08-29 MED ORDER — HYDROCODONE-ACETAMINOPHEN 5-325 MG PO TABS
1.0000 | ORAL_TABLET | Freq: Once | ORAL | Status: AC
  Administered 2016-08-29: 1 via ORAL

## 2016-08-29 NOTE — ED Triage Notes (Signed)
Pt  Stop  Up  Today      From  [facility]  Twisted  r  Foot          Today     Pain   And   Swelling

## 2016-08-29 NOTE — ED Provider Notes (Signed)
[facility] CSN: [id number]     Arrival date & time 08/29/16  1623 History   None    Chief Complaint  Patient presents with  . Foot Pain   (Consider location/radiation/quality/duration/timing/severity/associated sxs/prior Treatment) 61 year old male presents to clinic for evaluation of right foot pain. States he was walking, when he stumbled and fell, and twisted his foot. He's had some swelling and inflammation of his right foot and he has had difficulty walking and bearing weight.   The history is provided by the patient.  Foot Pain  This is a new problem. The current episode started 3 to 5 hours ago. The problem occurs constantly. The problem has not changed since onset.The symptoms are aggravated by walking and standing. Relieved by: Nothing tried. He has tried nothing for the symptoms. The treatment provided no relief.    Past Medical History:  Diagnosis Date  . Allergy   . Anxiety   . Eczema    hands, chronic and recurrent  . Hemorrhoids   . Hyperlipidemia   . S/P appendectomy   . S/P bilateral inguinal hernia repair    59 weeks old    Past Surgical History:  Procedure Laterality Date  . APPENDECTOMY    . INGUINAL HERNIA REPAIR     bil   Family History  Problem Relation Age of Onset  . Prostate cancer Father 25  . Diabetes Other   . Hypertension Other   . Cancer Other     prostate  . Heart disease Other   . Colon cancer Neg Hx    Social History  Substance Use Topics  . Smoking status: Never Smoker  . Smokeless tobacco: Never Used  . Alcohol use 1.5 oz/week    3 drink(s) per week    Review of Systems  Reason unable to perform ROS: as covered in HPI.  All other systems reviewed and are negative.   Allergies  Patient has no known allergies.  Home Medications   Prior to Admission medications   Medication Sig Start Date End Date Taking? Authorizing Provider  acyclovir (ZOVIRAX) 400 MG tablet Take 400 mg by mouth 2 (two) times daily.    Historical Provider,  MD  cetirizine (ZYRTEC) 10 MG tablet Take 10 mg by mouth daily as needed for allergies.    Historical Provider, MD  diphenhydrAMINE (BENADRYL) 25 MG tablet Take 25 mg by mouth every 6 (six) hours as needed.      Historical Provider, MD  HYDROcodone-acetaminophen (NORCO/VICODIN) 5-325 MG tablet Take 1 tablet by mouth every 6 (six) hours as needed. 08/29/16   Barnet Glasgow, NP  Multiple Vitamin (MULTIVITAMIN) tablet Take 1 tablet by mouth daily.      Historical Provider, MD   Meds Ordered and Administered this Visit   Medications  HYDROcodone-acetaminophen (NORCO/VICODIN) 5-325 MG per tablet 1 tablet (not administered)    BP (!) 144/62 (BP Location: Right Arm)   Pulse 78   Temp 98.6 F (37 C) (Oral)   Resp 18   SpO2 100%  No data found.   Physical Exam  Constitutional: He is oriented to person, place, and time. He appears well-developed and well-nourished. No distress.  HENT:  Head: Normocephalic and atraumatic.  Right Ear: External ear normal.  Left Ear: External ear normal.  Cardiovascular: Normal rate and regular rhythm.   Pulmonary/Chest: Effort normal and breath sounds normal.  Abdominal: Soft. Bowel sounds are normal. He exhibits no distension. There is no tenderness.  Musculoskeletal:       Right  ankle: He exhibits decreased range of motion and swelling. He exhibits no deformity. Tenderness. Head of 5th metatarsal tenderness found. No lateral malleolus and no medial malleolus tenderness found.       Feet:  Neurological: He is alert and oriented to person, place, and time.  Skin: Skin is warm and dry. Capillary refill takes less than 2 seconds. He is not diaphoretic.  Psychiatric: He has a normal mood and affect. His behavior is normal.  Nursing note and vitals reviewed.   Urgent Care Course     Procedures (including critical care time)  Labs Review Labs Reviewed - No data to display  Imaging Review Dg Foot Complete Right  Result Date: 08/29/2016 CLINICAL  DATA:  Injury. EXAM: RIGHT FOOT COMPLETE - 3+ VIEW COMPARISON:  No recent. FINDINGS: Fracture of the base of the right fifth metatarsal is present. Fracture slightly displaced. No other focal abnormality. Diffuse degenerative change. IMPRESSION: Slightly displaced fracture of the base of the right fifth metatarsal. Electronically Signed   By: Marcello Moores  Register   On: 08/29/2016 17:34     MDM   1. Closed displaced fracture of fifth metatarsal bone of right foot, initial encounter    Slightly displaced fracture of the base of the right fifth metatarsal. Patient given prescription for hydrocodone for pain, as well as counseling on safety points regarding narcotics. That was placed in a postop boot, given crutches. He was encouraged to follow up with orthopedics as soon as possible.    Barnet Glasgow, NP 08/29/16 2121

## 2016-08-29 NOTE — Discharge Instructions (Signed)
You have a displaced 5th metatarsal fracture on your right foot. We have provided crutches, postop boot. I recommend he stay off of his foot as much as possible, keep it elevated, rest, apply ice 15 minutes at a time up to 4 times a day. I have also prescribed a medicine for pain called hydrocodone, this medicine is a narcotic, it will cause drowsiness, and it is addictive. Do not take more than what is necessary, do not drink alcohol while taking, and do not operate any heavy machinery while taking this medicine. You may supplement this with over-the-counter ibuprofen every 6 hours. I recommend you follow-up with orthopedics, call them tomorrow, and arrange for follow-up care.

## 2016-08-30 ENCOUNTER — Encounter (INDEPENDENT_AMBULATORY_CARE_PROVIDER_SITE_OTHER): Payer: Self-pay | Admitting: Orthopaedic Surgery

## 2016-08-30 ENCOUNTER — Ambulatory Visit (INDEPENDENT_AMBULATORY_CARE_PROVIDER_SITE_OTHER): Payer: BLUE CROSS/BLUE SHIELD | Admitting: Orthopaedic Surgery

## 2016-08-30 VITALS — BP 125/77 | HR 59 | Resp 14 | Ht 69.0 in | Wt 185.0 lb

## 2016-08-30 DIAGNOSIS — S92354A Nondisplaced fracture of fifth metatarsal bone, right foot, initial encounter for closed fracture: Secondary | ICD-10-CM

## 2016-08-30 DIAGNOSIS — M79671 Pain in right foot: Secondary | ICD-10-CM

## 2016-08-30 NOTE — Progress Notes (Signed)
Office Visit Note   Patient: Mason Irwin           Date of Birth: 10-13-1955           MRN: [id number] Visit Date: 08/30/2016              Requested by: Dorena Cookey, MD [place], [place] [address] PCP: Joycelyn Man, MD   Assessment & Plan: Visit Diagnoses: Nondisplaced fracture base of right fifth metatarsal  Plan: Equalizer boot, limited weightbearing, follow-up in 2 weeks for repeat films. Long discussion regarding different treatment options including screw fixation. Patient would opt to wait and monitor healing.  Follow-Up Instructions: No Follow-up on file.   Orders:  No orders of the defined types were placed in this encounter.  No orders of the defined types were placed in this encounter.     Procedures: No procedures performed   Clinical Data: No additional findings.   Subjective: No chief complaint on file.    Mason Irwin is a 61 year old male presentsfor evaluation of right foot pain. esterday, states he was walking, when he stumbled(mistepped) and fell, and twisted his foot. He's had some swelling and inflammation of his right foot and he has had difficulty walking and bearing weight. Closed displaced fracture of fifth metatarsal bone of right foot dx at urgent care and xrays obtained on EPIC. Pt ambulates with Darco shoe and crutches.  Films of the right foot were reviewed. There is a nondisplaced fracture at the base of the fifth metatarsal right foot proximal to a typical Jones fracture  Review of Systems   Objective: Vital Signs: There were no vitals taken for this visit.  Physical Exam  Ortho Exam right foot exam with tenderness at the base of the right fifth metatarsal. Skin intact. Neurovascular exam intact. Swelling as expected.  Specialty Comments:  No specialty comments available.  Imaging: Dg Foot Complete Right  Result Date: 08/29/2016 CLINICAL DATA:  Injury. EXAM: RIGHT FOOT COMPLETE - 3+ VIEW  COMPARISON:  No recent. FINDINGS: Fracture of the base of the right fifth metatarsal is present. Fracture slightly displaced. No other focal abnormality. Diffuse degenerative change. IMPRESSION: Slightly displaced fracture of the base of the right fifth metatarsal. Electronically Signed   By: Marcello Moores  Register   On: 08/29/2016 17:34     PMFS History: Patient Active Problem List   Diagnosis Date Noted  . Routine general medical examination at a health care facility 09/29/2013  . Low back pain 10/04/2011  . HSV infection 07/10/2011  . DERMATITIS, CONTACT, NOS 03/17/2007   Past Medical History:  Diagnosis Date  . Allergy   . Anxiety   . Eczema    hands, chronic and recurrent  . Hemorrhoids   . Hyperlipidemia   . S/P appendectomy   . S/P bilateral inguinal hernia repair    70 weeks old     Family History  Problem Relation Age of Onset  . Prostate cancer Father 44  . Diabetes Other   . Hypertension Other   . Cancer Other     prostate  . Heart disease Other   . Colon cancer Neg Hx     Past Surgical History:  Procedure Laterality Date  . APPENDECTOMY    . INGUINAL HERNIA REPAIR     bil   Social History   Occupational History  . Not on file.   Social History Main Topics  . Smoking status: Never Smoker  . Smokeless tobacco: Never Used  .  Alcohol use 1.5 oz/week    3 drink(s) per week  . Drug use: No  . Sexual activity: Not on file

## 2016-08-31 ENCOUNTER — Telehealth (INDEPENDENT_AMBULATORY_CARE_PROVIDER_SITE_OTHER): Payer: Self-pay | Admitting: Orthopaedic Surgery

## 2016-08-31 NOTE — Telephone Encounter (Signed)
Patient's wife says the boot that was put on yesterday won't stay "inflated" when he pushes the red button on the boot. He is wondering if he is doing something wrong or if it's a faulty boot. They are requesting that Marcie Bal please call patient back at [phone]

## 2016-09-03 NOTE — Telephone Encounter (Signed)
LVMOM regarding call

## 2016-09-03 NOTE — Telephone Encounter (Signed)
Patient returned Janet's phone call and is requesting a call back today please.

## 2016-09-11 ENCOUNTER — Other Ambulatory Visit (INDEPENDENT_AMBULATORY_CARE_PROVIDER_SITE_OTHER): Payer: Self-pay | Admitting: Orthopedic Surgery

## 2016-09-11 ENCOUNTER — Telehealth (INDEPENDENT_AMBULATORY_CARE_PROVIDER_SITE_OTHER): Payer: Self-pay | Admitting: Orthopaedic Surgery

## 2016-09-11 NOTE — Telephone Encounter (Signed)
Do not see in the chart where any tramadol prescription was given to the patient. Please call the pharmacy and find out if you would if there was a prescription written

## 2016-09-11 NOTE — Telephone Encounter (Signed)
Please advise 

## 2016-09-11 NOTE — Telephone Encounter (Signed)
Patient's wife is requesting rx for Tramadol be sent to [facility] [address].

## 2016-09-12 NOTE — Telephone Encounter (Signed)
Patients wife called to check the status of tramadol refill  cb# [phone] [facility] [address]

## 2016-09-13 NOTE — Telephone Encounter (Signed)
LVMOM . Told him to discuss at 09/17/16 appt. We have not given tramadol to him

## 2016-09-17 ENCOUNTER — Ambulatory Visit (INDEPENDENT_AMBULATORY_CARE_PROVIDER_SITE_OTHER): Payer: BLUE CROSS/BLUE SHIELD

## 2016-09-17 ENCOUNTER — Encounter (INDEPENDENT_AMBULATORY_CARE_PROVIDER_SITE_OTHER): Payer: Self-pay | Admitting: Orthopaedic Surgery

## 2016-09-17 ENCOUNTER — Ambulatory Visit (INDEPENDENT_AMBULATORY_CARE_PROVIDER_SITE_OTHER): Payer: BLUE CROSS/BLUE SHIELD | Admitting: Orthopaedic Surgery

## 2016-09-17 VITALS — BP 105/49 | HR 70 | Resp 14 | Ht 69.0 in | Wt 185.0 lb

## 2016-09-17 DIAGNOSIS — S62306D Unspecified fracture of fifth metacarpal bone, right hand, subsequent encounter for fracture with routine healing: Secondary | ICD-10-CM

## 2016-09-17 MED ORDER — TRAMADOL HCL 50 MG PO TABS: ORAL_TABLET | ORAL | 0 refills | Status: DC

## 2016-09-17 NOTE — Progress Notes (Signed)
   Office Visit Note   Patient: Mason Irwin           Date of Birth: Aug 19, 1955           MRN: [id number] Visit Date: 09/17/2016              Requested by: Dorena Cookey, MD [place], [place] [address] PCP: Joycelyn Man, MD   Assessment & Plan: Visit Diagnoses: Closed, nondisplaced fracture base of right fifth metatarsal   Plan: Continue with equalizer boot with increased weightbearing to tolerance. Return to office 3 weeks for repeat films.  Follow-Up Instructions: No Follow-up on file.   Orders:  No orders of the defined types were placed in this encounter.  No orders of the defined types were placed in this encounter.     Procedures: No procedures performed   Clinical Data: No additional findings.   Subjective: No chief complaint on file.   Mason Irwin is a 61 yr old male that is back for a follow up with a nondisplaced fracture base of right fifth metatarsal  Equalizer boot, pt not weightbearing on right foot. He relates he walks on his heel, he has a DARCO shoe at home. , follow-up in 2 weeks for repeat films. Long discussion regarding different treatment options including p visit from  Mason Irwin is actually quite comfortable with minimal weightbearing to the right lower extremity in the equalizer boot.Review of Systems   Objective: Vital Signs: There were no vitals taken for this visit.  Physical Exam  Ortho Exam no toe edema. Neurovascular exam intact. Equalizer boot not removed today with minimal pain with weightbearing in the equalizer boot.  Specialty Comments:  No specialty comments available.  Imaging: No results found.   PMFS History: Patient Active Problem List   Diagnosis Date Noted  . Routine general medical examination at a health care facility 09/29/2013  . Low back pain 10/04/2011  . HSV infection 07/10/2011  . DERMATITIS, CONTACT, NOS 03/17/2007   Past Medical History:  Diagnosis Date  . Allergy     . Anxiety   . Eczema    hands, chronic and recurrent  . Hemorrhoids   . Hyperlipidemia   . S/P appendectomy   . S/P bilateral inguinal hernia repair    10 weeks old     Family History  Problem Relation Age of Onset  . Prostate cancer Father 39  . Diabetes Other   . Hypertension Other   . Cancer Other     prostate  . Heart disease Other   . Colon cancer Neg Hx     Past Surgical History:  Procedure Laterality Date  . APPENDECTOMY    . INGUINAL HERNIA REPAIR     bil   Social History   Occupational History  . Not on file.   Social History Main Topics  . Smoking status: Never Smoker  . Smokeless tobacco: Never Used  . Alcohol use 1.5 oz/week    3 drink(s) per week  . Drug use: No  . Sexual activity: Not on file

## 2016-10-08 ENCOUNTER — Ambulatory Visit (INDEPENDENT_AMBULATORY_CARE_PROVIDER_SITE_OTHER): Payer: BLUE CROSS/BLUE SHIELD | Admitting: Orthopaedic Surgery

## 2017-03-01 ENCOUNTER — Encounter: Payer: Self-pay | Admitting: Family Medicine

## 2019-11-28 ENCOUNTER — Encounter (HOSPITAL_COMMUNITY): Payer: Self-pay | Admitting: *Deleted

## 2019-11-28 ENCOUNTER — Emergency Department (HOSPITAL_COMMUNITY)
Admission: EM | Admit: 2019-11-28 | Discharge: 2019-11-28 | Disposition: A | Discharge status: Home / Self Care | Payer: 59 | Attending: Emergency Medicine | Admitting: Emergency Medicine

## 2019-11-28 ENCOUNTER — Other Ambulatory Visit: Payer: Self-pay

## 2019-11-28 DIAGNOSIS — Y9355 Activity, bike riding: Secondary | ICD-10-CM | POA: Insufficient documentation

## 2019-11-28 DIAGNOSIS — S01419A Laceration without foreign body of unspecified cheek and temporomandibular area, initial encounter: Secondary | ICD-10-CM | POA: Insufficient documentation

## 2019-11-28 DIAGNOSIS — Y92410 Unspecified street and highway as the place of occurrence of the external cause: Secondary | ICD-10-CM | POA: Insufficient documentation

## 2019-11-28 DIAGNOSIS — Y999 Unspecified external cause status: Secondary | ICD-10-CM | POA: Diagnosis not present

## 2019-11-28 DIAGNOSIS — S0990XA Unspecified injury of head, initial encounter: Secondary | ICD-10-CM | POA: Diagnosis present

## 2019-11-28 DIAGNOSIS — S0181XA Laceration without foreign body of other part of head, initial encounter: Secondary | ICD-10-CM

## 2019-11-28 MED ORDER — TRAMADOL HCL 50 MG PO TABS: 50.0000 mg | ORAL_TABLET | Freq: Four times a day (QID) | ORAL | 0 refills | Status: AC | PRN

## 2019-11-28 MED ORDER — LIDOCAINE HCL (PF) 1 % IJ SOLN
5.0000 mL | Freq: Once | INTRAMUSCULAR | Status: AC
  Administered 2019-11-28: 5 mL via INTRADERMAL
  Filled 2019-11-28: qty 30
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
## 2019-11-28 NOTE — Discharge Instructions (Addendum)
I have placed 5 stiches to your left eyebrow, please have these removed within 5-7 days.   You may apply bacitracin to your left eyebrow, to help with healing process.  I have prescribed a short course of pain medication to help with pain control at home.  Please be aware this medication can make you drowsy.  Not take this medication while driving.

## 2019-12-12 ENCOUNTER — Emergency Department (HOSPITAL_COMMUNITY): Payer: 59

## 2019-12-12 ENCOUNTER — Encounter (HOSPITAL_COMMUNITY): Payer: Self-pay | Admitting: Emergency Medicine

## 2019-12-12 ENCOUNTER — Other Ambulatory Visit: Payer: Self-pay

## 2019-12-12 ENCOUNTER — Emergency Department (HOSPITAL_COMMUNITY)
Admission: EM | Admit: 2019-12-12 | Discharge: 2019-12-12 | Disposition: A | Discharge status: Home / Self Care | Payer: 59 | Attending: Emergency Medicine | Admitting: Emergency Medicine

## 2019-12-12 DIAGNOSIS — Y9355 Activity, bike riding: Secondary | ICD-10-CM | POA: Diagnosis not present

## 2019-12-12 DIAGNOSIS — S42024A Nondisplaced fracture of shaft of right clavicle, initial encounter for closed fracture: Secondary | ICD-10-CM

## 2019-12-12 DIAGNOSIS — W19XXXA Unspecified fall, initial encounter: Secondary | ICD-10-CM

## 2019-12-12 DIAGNOSIS — Y92482 Bike path as the place of occurrence of the external cause: Secondary | ICD-10-CM | POA: Insufficient documentation

## 2019-12-12 DIAGNOSIS — Y998 Other external cause status: Secondary | ICD-10-CM | POA: Insufficient documentation

## 2019-12-12 DIAGNOSIS — S2241XA Multiple fractures of ribs, right side, initial encounter for closed fracture: Secondary | ICD-10-CM | POA: Diagnosis not present

## 2019-12-12 DIAGNOSIS — M25511 Pain in right shoulder: Secondary | ICD-10-CM | POA: Diagnosis present

## 2019-12-12 HISTORY — DX: Nondisplaced fracture of shaft of right clavicle, initial encounter for closed fracture: S42.024A

## 2019-12-12 HISTORY — DX: Multiple fractures of ribs, right side, initial encounter for closed fracture: S22.41XA

## 2019-12-12 MED ORDER — OXYCODONE-ACETAMINOPHEN 5-325 MG PO TABS
1.0000 | ORAL_TABLET | Freq: Once | ORAL | Status: AC
  Administered 2019-12-12: 1 via ORAL
  Filled 2019-12-12: qty 1

## 2019-12-12 MED ORDER — OXYCODONE-ACETAMINOPHEN 5-325 MG PO TABS: 1.0000 | ORAL_TABLET | ORAL | 0 refills | Status: DC | PRN
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
## 2019-12-12 NOTE — Discharge Instructions (Addendum)
Your x-rays today show that you have a broken collarbone and 3 broken ribs on the right side.  You can try using a pillow to apply firm pressure to cough and take deep breaths several times a day.  You may also use the incentive spirometer.  This will help prevent pneumonia.  You will need to follow-up with your primary care provider for recheck.  I have also provided the clinic information in [place] if you need to establish primary care.  Dr.Xu will also see you for follow-up regarding your collarbone fracture.  You may call his office next week to arrange a follow-up appointment.  Return to the emergency department if you develop any worsening symptoms

## 2020-01-14 DIAGNOSIS — S42023A Displaced fracture of shaft of unspecified clavicle, initial encounter for closed fracture: Secondary | ICD-10-CM | POA: Insufficient documentation

## 2020-01-28 ENCOUNTER — Ambulatory Visit (INDEPENDENT_AMBULATORY_CARE_PROVIDER_SITE_OTHER): Payer: 59

## 2020-01-28 ENCOUNTER — Ambulatory Visit (INDEPENDENT_AMBULATORY_CARE_PROVIDER_SITE_OTHER): Payer: 59 | Admitting: Podiatry

## 2020-01-28 ENCOUNTER — Other Ambulatory Visit: Payer: Self-pay

## 2020-01-28 ENCOUNTER — Encounter: Payer: Self-pay | Admitting: Podiatry

## 2020-01-28 ENCOUNTER — Ambulatory Visit: Payer: 59

## 2020-01-28 VITALS — Temp 97.3°F

## 2020-01-28 DIAGNOSIS — D3610 Benign neoplasm of peripheral nerves and autonomic nervous system, unspecified: Secondary | ICD-10-CM

## 2020-01-28 DIAGNOSIS — M79671 Pain in right foot: Secondary | ICD-10-CM

## 2020-01-28 DIAGNOSIS — G5761 Lesion of plantar nerve, right lower limb: Secondary | ICD-10-CM | POA: Diagnosis not present

## 2020-01-28 DIAGNOSIS — M779 Enthesopathy, unspecified: Secondary | ICD-10-CM | POA: Diagnosis not present

## 2020-01-28 NOTE — Patient Instructions (Signed)
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
## 2020-02-04 ENCOUNTER — Encounter: Payer: Self-pay | Admitting: Podiatry

## 2020-02-04 ENCOUNTER — Other Ambulatory Visit: Payer: Self-pay

## 2020-02-04 ENCOUNTER — Ambulatory Visit (INDEPENDENT_AMBULATORY_CARE_PROVIDER_SITE_OTHER): Payer: 59 | Admitting: Podiatry

## 2020-02-04 VITALS — Temp 97.1°F

## 2020-02-04 DIAGNOSIS — D3610 Benign neoplasm of peripheral nerves and autonomic nervous system, unspecified: Secondary | ICD-10-CM

## 2020-02-05 ENCOUNTER — Other Ambulatory Visit: Payer: Self-pay | Admitting: Podiatry

## 2020-02-05 DIAGNOSIS — D3610 Benign neoplasm of peripheral nerves and autonomic nervous system, unspecified: Secondary | ICD-10-CM

## 2020-02-05 DIAGNOSIS — M779 Enthesopathy, unspecified: Secondary | ICD-10-CM

## 2020-02-05 NOTE — Progress Notes (Signed)
Subjective:   Patient ID: Mason Irwin, male   DOB: 64 y.o.   MRN: [id number]   HPI Patient presents with his wife stating that he is still having pain and he was really not able to test it as he had wanted with the types of activities that cause the problem.  States he seemed to get more pain into the outside of the foot after we did the initial injection   ROS      Objective:  Physical Exam  Neurovascular status intact he still appears to have a positive Mulder sign with shooting pains in the interspace and I was not able to ascertain areas of other problem     Assessment:  Appears to be neuroma symptomatology.  Very difficult to make full diagnosis     Plan:  Spent a great deal of time going over different options and at this point patient wants to try to stress his foot and come back in.  I did discuss MRI but I am not sure that would give [place] an answer and I did discuss exploratory surgery with removal of probable neuroma.  We will see him back in 4 weeks to make a decision and I do think he wants to get something done by the end of the year.  He will try topical medicines he will try wider shoes but I do want him to try to flared up for his next visit in 1 month

## 2020-03-02 ENCOUNTER — Ambulatory Visit (INDEPENDENT_AMBULATORY_CARE_PROVIDER_SITE_OTHER): Payer: 59 | Admitting: Physical Medicine and Rehabilitation

## 2020-03-02 ENCOUNTER — Other Ambulatory Visit: Payer: Self-pay

## 2020-03-02 ENCOUNTER — Encounter: Payer: Self-pay | Admitting: Physical Medicine and Rehabilitation

## 2020-03-02 ENCOUNTER — Ambulatory Visit: Payer: Self-pay

## 2020-03-02 VITALS — BP 119/75 | HR 59

## 2020-03-02 DIAGNOSIS — M25511 Pain in right shoulder: Secondary | ICD-10-CM

## 2020-03-02 DIAGNOSIS — M545 Low back pain, unspecified: Secondary | ICD-10-CM

## 2020-03-02 DIAGNOSIS — M542 Cervicalgia: Secondary | ICD-10-CM | POA: Diagnosis not present

## 2020-03-02 DIAGNOSIS — M546 Pain in thoracic spine: Secondary | ICD-10-CM

## 2020-03-02 DIAGNOSIS — S29019A Strain of muscle and tendon of unspecified wall of thorax, initial encounter: Secondary | ICD-10-CM

## 2020-03-02 DIAGNOSIS — M41125 Adolescent idiopathic scoliosis, thoracolumbar region: Secondary | ICD-10-CM

## 2020-03-02 DIAGNOSIS — G8929 Other chronic pain: Secondary | ICD-10-CM

## 2020-03-02 DIAGNOSIS — Z9889 Other specified postprocedural states: Secondary | ICD-10-CM

## 2020-03-02 DIAGNOSIS — M7918 Myalgia, other site: Secondary | ICD-10-CM

## 2020-03-02 DIAGNOSIS — S42024S Nondisplaced fracture of shaft of right clavicle, sequela: Secondary | ICD-10-CM
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
## 2020-03-03 ENCOUNTER — Ambulatory Visit: Payer: 59 | Admitting: Podiatry

## 2020-03-03 ENCOUNTER — Encounter: Payer: Self-pay | Admitting: Physical Medicine and Rehabilitation

## 2020-03-03 DIAGNOSIS — H903 Sensorineural hearing loss, bilateral: Secondary | ICD-10-CM | POA: Insufficient documentation
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
## 2020-03-09 ENCOUNTER — Encounter: Payer: Self-pay | Admitting: Physical Therapy

## 2020-03-09 ENCOUNTER — Ambulatory Visit (INDEPENDENT_AMBULATORY_CARE_PROVIDER_SITE_OTHER): Payer: 59 | Admitting: Physical Therapy

## 2020-03-09 ENCOUNTER — Other Ambulatory Visit: Payer: Self-pay

## 2020-03-09 DIAGNOSIS — M546 Pain in thoracic spine: Secondary | ICD-10-CM

## 2020-03-09 DIAGNOSIS — M6281 Muscle weakness (generalized): Secondary | ICD-10-CM | POA: Diagnosis not present

## 2020-03-09 DIAGNOSIS — M542 Cervicalgia: Secondary | ICD-10-CM

## 2020-03-09 NOTE — Patient Instructions (Signed)
Access Code: [email or web address] URL: [email or web address] Date: 03/09/2020 Prepared by: Elsie Ra  Exercises Seated Cervical Retraction - 2 x daily - 6 x weekly - 10 reps - 1-3 sets Seated Cervical Sidebending Stretch - 2 x daily - 6 x weekly - 2-3 reps - 1 sets - 30 sec hold Seated Thoracic Lumbar Extension with Pectoralis Stretch - 2 x daily - 6 x weekly - 3 sets - 10 reps Sidelying Thoracic and Shoulder Rotation - 2 x daily - 6 x weekly - 3 sets - 10 reps Cat-Camel - 2 x daily - 6 x weekly - 10 reps - 1 sets - 5 hold Prone Single Arm Shoulder Y - 2 x daily - 6 x weekly - 3 sets - 10 reps Prone Shoulder Horizontal Abduction - 2 x daily - 3-4 x weekly - 10 reps - 2-3 sets - 3 sec hold Standing Bent Over Single Arm Scapular Row with Table Support - 2 x daily - 6 x weekly - 3 sets - 10 reps  Patient Education TENS Therapy
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
## 2020-03-14 ENCOUNTER — Encounter: Payer: 59 | Admitting: Rehabilitative and Restorative Service Providers"

## 2020-03-16 ENCOUNTER — Ambulatory Visit: Payer: 59 | Admitting: Podiatry

## 2020-04-01 ENCOUNTER — Other Ambulatory Visit: Payer: Self-pay

## 2020-04-01 ENCOUNTER — Ambulatory Visit (INDEPENDENT_AMBULATORY_CARE_PROVIDER_SITE_OTHER): Payer: 59 | Admitting: Rehabilitative and Restorative Service Providers"

## 2020-04-01 ENCOUNTER — Encounter: Payer: Self-pay | Admitting: Rehabilitative and Restorative Service Providers"

## 2020-04-01 DIAGNOSIS — M546 Pain in thoracic spine: Secondary | ICD-10-CM

## 2020-04-01 DIAGNOSIS — M542 Cervicalgia: Secondary | ICD-10-CM

## 2020-04-01 DIAGNOSIS — M6281 Muscle weakness (generalized): Secondary | ICD-10-CM
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
## 2020-04-04 ENCOUNTER — Telehealth: Payer: Self-pay | Admitting: Gastroenterology

## 2020-04-04 NOTE — Telephone Encounter (Signed)
Hey Dr Carlean Purl, this pt was recommended by Dr. Eulis Foster to see you but the pt is a former Dr Ardis Hughs pt, last seen 2012, pt would like to switch care to you if possible. Would you be ok with this switch?

## 2020-04-04 NOTE — Telephone Encounter (Signed)
That switch is okay with me if it is okay with Dr. Carlean Purl.

## 2020-04-04 NOTE — Telephone Encounter (Signed)
Ok

## 2020-04-06 ENCOUNTER — Encounter: Payer: Self-pay | Admitting: Internal Medicine

## 2020-04-06 NOTE — Telephone Encounter (Signed)
Left msg on pt's vm to call back and schedule with Dr Carlean Purl

## 2020-04-07 ENCOUNTER — Encounter: Payer: 59 | Admitting: Rehabilitative and Restorative Service Providers"

## 2020-04-13 ENCOUNTER — Ambulatory Visit (INDEPENDENT_AMBULATORY_CARE_PROVIDER_SITE_OTHER): Payer: 59 | Admitting: Physical Medicine and Rehabilitation

## 2020-04-13 ENCOUNTER — Other Ambulatory Visit: Payer: Self-pay

## 2020-04-13 VITALS — BP 122/81 | HR 73

## 2020-04-13 DIAGNOSIS — M545 Low back pain, unspecified: Secondary | ICD-10-CM | POA: Diagnosis not present

## 2020-04-13 DIAGNOSIS — M41125 Adolescent idiopathic scoliosis, thoracolumbar region: Secondary | ICD-10-CM

## 2020-04-13 DIAGNOSIS — G8929 Other chronic pain: Secondary | ICD-10-CM

## 2020-04-13 DIAGNOSIS — Z9889 Other specified postprocedural states: Secondary | ICD-10-CM

## 2020-04-13 DIAGNOSIS — M7918 Myalgia, other site: Secondary | ICD-10-CM | POA: Diagnosis not present

## 2020-04-13 DIAGNOSIS — M546 Pain in thoracic spine: Secondary | ICD-10-CM

## 2020-04-13 NOTE — Progress Notes (Signed)
PT "going ok." Has only been once due to scheduling issues. Thinks PT is good idea, but thinks he needs to expand that into whole body exercises. States that he does get depressed thinking about his future life with pain. Numeric Pain Rating Scale and Functional Assessment Average Pain 4 Pain Right Now 2 My pain is dull and aching Pain is worse with: sitting Pain improves with: activity   In the last MONTH (on 0-10 scale) has pain interfered with the following?  1. General activity like being  able to carry out your everyday physical activities such as walking, climbing stairs, carrying groceries, or moving a chair?  Rating(5)  2. Relation with others like being able to carry out your usual social activities and roles such as  activities at home, at work and in your community. Rating(5)  3. Enjoyment of life such that you have  been bothered by emotional problems such as feeling anxious, depressed or irritable?  Rating(5)

## 2020-04-14 ENCOUNTER — Encounter: Payer: Self-pay | Admitting: Physical Medicine and Rehabilitation
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
## 2020-06-09 ENCOUNTER — Ambulatory Visit (INDEPENDENT_AMBULATORY_CARE_PROVIDER_SITE_OTHER): Payer: 59 | Admitting: Internal Medicine

## 2020-06-09 ENCOUNTER — Other Ambulatory Visit (INDEPENDENT_AMBULATORY_CARE_PROVIDER_SITE_OTHER): Payer: 59

## 2020-06-09 ENCOUNTER — Other Ambulatory Visit: Payer: Self-pay | Admitting: Internal Medicine

## 2020-06-09 ENCOUNTER — Encounter: Payer: Self-pay | Admitting: Internal Medicine

## 2020-06-09 VITALS — BP 114/60 | HR 64 | Ht 69.0 in | Wt 167.1 lb

## 2020-06-09 DIAGNOSIS — K529 Noninfective gastroenteritis and colitis, unspecified: Secondary | ICD-10-CM | POA: Diagnosis not present

## 2020-06-09 LAB — TSH: TSH: 1.63 u[IU]/mL (ref 0.35–4.50)

## 2020-06-09 MED ORDER — PEG-KCL-NACL-NASULF-NA ASC-C 100 G PO SOLR: 1.0000 | ORAL | 0 refills | Status: DC
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
## 2020-06-09 NOTE — Telephone Encounter (Signed)
Left detailed message to call us back and we will re-send his prep kit to another pharmacy.

## 2020-06-09 NOTE — Patient Instructions (Signed)
You have been scheduled for a colonoscopy. Please follow written instructions given to you at your visit today.  Please pick up your prep supplies at the pharmacy within the next 1-3 days. If you use inhalers (even only as needed), please bring them with you on the day of your procedure.   Due to recent changes in healthcare laws, you may see the results of your imaging and laboratory studies on MyChart before your provider has had a chance to review them.  We understand that in some cases there may be results that are confusing or concerning to you. Not all laboratory results come back in the same time frame and the provider may be waiting for multiple results in order to interpret others.  Please give [place] 48 hours in order for your provider to thoroughly review all the results before contacting the office for clarification of your results.   Your provider has requested that you go to the basement level for lab work before leaving today. Press "B" on the elevator. The lab is located at the first door on the left as you exit the elevator.  I appreciate the opportunity to care for you. Stan Head, MD, [facility]

## 2020-06-13 NOTE — Telephone Encounter (Signed)
I left a detailed message to call me back with another pharmacy name and I will send in the generic moviprep.

## 2020-06-14 ENCOUNTER — Telehealth: Payer: Self-pay | Admitting: Internal Medicine

## 2020-06-14 ENCOUNTER — Other Ambulatory Visit: Payer: Self-pay | Admitting: Internal Medicine

## 2020-06-14 MED ORDER — PEG-KCL-NACL-NASULF-NA ASC-C 100 G PO SOLR: 1.0000 | ORAL | 0 refills | Status: DC

## 2020-06-14 NOTE — Telephone Encounter (Signed)
Returned his call and I will resend the generic moviprep to [facility] on [address] since [facility] no longer carry[facility] it.

## 2020-06-14 NOTE — Telephone Encounter (Signed)
I am going to resend the prep to [facility] on [address] per patient's request.

## 2020-06-16 NOTE — Telephone Encounter (Signed)
I left him a detailed message that we will work on getting him a prep kit. I will call him back when I get more information.

## 2020-06-16 NOTE — Telephone Encounter (Signed)
I spoke with Mason Irwin and he is aware I am working on this.

## 2020-06-16 NOTE — Telephone Encounter (Signed)
Pt called to let you know that Moviprep is over $140,, the pharmacy told him that golytely and similar ones are more affordable. Pls call pt.

## 2020-06-23 MED ORDER — PLENVU 140 G PO SOLR: 1.0000 | ORAL | 0 refills | Status: DC

## 2020-06-23 NOTE — Telephone Encounter (Signed)
Pt is wanting to inform the nurse that [facility] only option is [facility], pt would like to further discuss with the nurse about his other options of medication.

## 2020-06-23 NOTE — Addendum Note (Signed)
Addended by: [place], Layci Stenglein E on: 06/23/2020 01:39 PM   Modules accepted: Orders

## 2020-06-23 NOTE — Telephone Encounter (Signed)
Benjiman will come by today and get his Plenvu sample kit and new instructions.

## 2020-07-22 ENCOUNTER — Encounter: Payer: Self-pay | Admitting: Internal Medicine

## 2020-07-26 ENCOUNTER — Encounter: Payer: Self-pay | Admitting: Internal Medicine

## 2020-07-26 ENCOUNTER — Other Ambulatory Visit: Payer: Self-pay

## 2020-07-26 ENCOUNTER — Ambulatory Visit: Payer: Managed Care, Other (non HMO) | Admitting: Internal Medicine

## 2020-07-26 VITALS — BP 113/72 | HR 67 | Temp 98.9°F | Resp 13 | Ht 69.0 in | Wt 167.0 lb

## 2020-07-26 DIAGNOSIS — K529 Noninfective gastroenteritis and colitis, unspecified: Secondary | ICD-10-CM

## 2020-07-26 DIAGNOSIS — D128 Benign neoplasm of rectum: Secondary | ICD-10-CM

## 2020-07-26 DIAGNOSIS — D129 Benign neoplasm of anus and anal canal: Secondary | ICD-10-CM

## 2020-07-26 MED ORDER — SODIUM CHLORIDE 0.9 % IV SOLN: 500.0000 mL | Freq: Once | INTRAVENOUS | Status: DC

## 2020-07-26 NOTE — Progress Notes (Signed)
Called to room to assist during endoscopic procedure.  Patient ID and intended procedure confirmed with present staff. Received instructions for my participation in the procedure from the performing physician.  

## 2020-07-26 NOTE — Progress Notes (Signed)
PT taken to PACU. Monitors in place. VSS. Report given to RN. 

## 2020-07-26 NOTE — Progress Notes (Signed)
VS by CW  I have reviewed the patient's medical history in detail and updated the computerized patient record.  

## 2020-07-26 NOTE — Op Note (Signed)
[place] Patient Name: Mason Irwin Procedure Date: 07/26/2020 1:22 PM MRN: [id number] Endoscopist: Gatha Mayer , MD Age: 65 Referring MD:  Date of Birth: 1955-09-13 Gender: Male Account #: [id number] Procedure:                Colonoscopy Indications:              Clinically significant diarrhea of unexplained                            origin Medicines:                Propofol per Anesthesia, Monitored Anesthesia Care Procedure:                Pre-Anesthesia Assessment:                           - Prior to the procedure, a History and Physical                            was performed, and patient medications and                            allergies were reviewed. The patient's tolerance of                            previous anesthesia was also reviewed. The risks                            and benefits of the procedure and the sedation                            options and risks were discussed with the patient.                            All questions were answered, and informed consent                            was obtained. Prior Anticoagulants: The patient has                            taken no previous anticoagulant or antiplatelet                            agents. ASA Grade Assessment: II - A patient with                            mild systemic disease. After reviewing the risks                            and benefits, the patient was deemed in                            satisfactory condition to undergo the procedure.  After obtaining informed consent, the colonoscope                            was passed under direct vision. Throughout the                            procedure, the patient's blood pressure, pulse, and                            oxygen saturations were monitored continuously. The                            Olympus CF-HQ190 [id number] [id number]) Colonoscope was                            introduced through the anus and  advanced to the the                            terminal ileum, with identification of the                            appendiceal orifice and IC valve. The colonoscopy                            was performed without difficulty. The patient                            tolerated the procedure well. The quality of the                            bowel preparation was excellent. The bowel                            preparation used was Plenvu via split dose                            instruction. The ileocecal valve, appendiceal                            orifice, and rectum were photographed. Scope In: 1:51:58 PM Scope Out: 2:08:43 PM Scope Withdrawal Time: 0 hours 13 minutes 15 seconds  Total Procedure Duration: 0 hours 16 minutes 45 seconds  Findings:                 The perianal and digital rectal examinations were                            normal. Pertinent negatives include normal prostate                            (size, shape, and consistency).                           The terminal ileum appeared normal.  Internal hemorrhoids in rectum.                           A 2 mm polyp was found in the rectum. The polyp was                            sessile. The polyp was removed with a cold biopsy                            forceps. Resection and retrieval were complete.                            Verification of patient identification for the                            specimen was done. Estimated blood loss was minimal.                           The exam was otherwise without abnormality on                            direct and retroflexion views.                           Biopsies for histology were taken with a cold                            forceps from the ascending colon, transverse colon,                            descending colon, sigmoid colon and rectum for                            evaluation of microscopic colitis. Complications:            No  immediate complications. Estimated Blood Loss:     Estimated blood loss was minimal. Impression:               - The examined portion of the ileum was normal.                           - One 2 mm polyp in the rectum, removed with a cold                            biopsy forceps. Resected and retrieved.                           - Internal hemorrhoids identified                           - The examination was otherwise normal on direct                            and retroflexion views.                           -  Biopsies were taken with a cold forceps from the                            ascending colon, transverse colon, descending                            colon, sigmoid colon and rectum for evaluation of                            microscopic colitis. Recommendation:           - Patient has a contact number available for                            emergencies. The signs and symptoms of potential                            delayed complications were discussed with the                            patient. Return to normal activities tomorrow.                            Written discharge instructions were provided to the                            patient.                           - Resume previous diet.                           - Continue present medications.                           - Repeat colonoscopy is recommended. The                            colonoscopy date will be determined after pathology                            results from today's exam become available for                            review. Gatha Mayer, MD 07/26/2020 2:16:31 PM This report has been signed electronically.

## 2020-07-26 NOTE — Patient Instructions (Addendum)
There was a tiny rectal polyp - removed. Not related to diarrhea.  All else looked normal. I took colon biopsies to see if there is microscopic inflammation.  I should have news about this by next week and will contact you.  I appreciate the opportunity to care for you. Gatha Mayer, MD, FACG   YOU HAD AN ENDOSCOPIC PROCEDURE TODAY AT [facility]:   Refer to the procedure report that was given to you for any specific questions about what was found during the examination.  If the procedure report does not answer your questions, please call your gastroenterologist to clarify.  If you requested that your care partner not be given the details of your procedure findings, then the procedure report has been included in a sealed envelope for you to review at your convenience later.  YOU SHOULD EXPECT: Some feelings of bloating in the abdomen. Passage of more gas than usual.  Walking can help get rid of the air that was put into your GI tract during the procedure and reduce the bloating. If you had a lower endoscopy (such as a colonoscopy or flexible sigmoidoscopy) you may notice spotting of blood in your stool or on the toilet paper. If you underwent a bowel prep for your procedure, you may not have a normal bowel movement for a few days.  Please Note:  You might notice some irritation and congestion in your nose or some drainage.  This is from the oxygen used during your procedure.  There is no need for concern and it should clear up in a day or so.  SYMPTOMS TO REPORT IMMEDIATELY:   Following lower endoscopy (colonoscopy or flexible sigmoidoscopy):  Excessive amounts of blood in the stool  Significant tenderness or worsening of abdominal pains  Swelling of the abdomen that is new, acute  Fever of 100F or higher   For urgent or emergent issues, a gastroenterologist can be reached at any hour by calling [phone]. Do not use MyChart messaging for urgent concerns.     DIET:  We do recommend a small meal at first, but then you may proceed to your regular diet.  Drink plenty of fluids but you should avoid alcoholic beverages for 24 hours.  ACTIVITY:  You should plan to take it easy for the rest of today and you should NOT DRIVE or use heavy machinery until tomorrow (because of the sedation medicines used during the test).    FOLLOW UP: Our staff will call the number listed on your records 48-72 hours following your procedure to check on you and address any questions or concerns that you may have regarding the information given to you following your procedure. If we do not reach you, we will leave a message.  We will attempt to reach you two times.  During this call, we will ask if you have developed any symptoms of COVID 19. If you develop any symptoms (ie: fever, flu-like symptoms, shortness of breath, cough etc.) before then, please call [phone].  If you test positive for Covid 19 in the 2 weeks post procedure, please call and report this information to [place].    If any biopsies were taken you will be contacted by phone or by letter within the next 1-3 weeks.  Please call us at [phone] if you have not heard about the biopsies in 3 weeks.    SIGNATURES/CONFIDENTIALITY: You and/or your care partner have signed paperwork which will be entered into your electronic medical  record.  These signatures attest to the fact that that the information above on your After Visit Summary has been reviewed and is understood.  Full responsibility of the confidentiality of this discharge information lies with you and/or your care-partner. 

## 2020-07-28 ENCOUNTER — Telehealth: Payer: Self-pay | Admitting: *Deleted

## 2020-07-28 ENCOUNTER — Telehealth: Payer: Self-pay

## 2020-07-28 NOTE — Telephone Encounter (Signed)
Left message on follow up call. 

## 2020-07-28 NOTE — Telephone Encounter (Signed)
  Follow up Call-  Call back number 07/26/2020  Post procedure Call Back phone  # [phone]  Permission to leave phone message Yes  Some recent data might be hidden     Patient questions:  Do you have a fever, pain , or abdominal swelling? No. Pain Score  0 *  Have you tolerated food without any problems? Yes.    Have you been able to return to your normal activities? Yes.    Do you have any questions about your discharge instructions: Diet   No. Medications  No. Follow up visit  No.  Do you have questions or concerns about your Care? No.  Actions: * If pain score is 4 or above: No action needed, pain <4.  1. Have you developed a fever since your procedure? no  2.   Have you had an respiratory symptoms (SOB or cough) since your procedure? no  3.   Have you tested positive for COVID 19 since your procedure no  4.   Have you had any family members/close contacts diagnosed with the COVID 19 since your procedure?  no   If yes to any of these questions please route to Joylene John, RN and Joella Prince, RN

## 2020-08-16 ENCOUNTER — Encounter: Payer: Self-pay | Admitting: Internal Medicine

## 2020-09-21 ENCOUNTER — Telehealth: Payer: Self-pay

## 2020-09-21 NOTE — Telephone Encounter (Signed)
Dr. Carlean Purl,   I received a call from Dominica Severin, the owner of [facility], he wanted to report SIBO breath test results. He states that the patient tested positive at the end of small intestine, he said he knows this varies with patients due to transit time so if you would like to further discuss you can reach him directly at [phone]. He did fax over the report as well. Thanks

## 2020-10-11 NOTE — Telephone Encounter (Signed)
Left message with patient about abnormal SIBO test and that I needed to call him in further follow-up

## 2020-10-13 NOTE — Telephone Encounter (Signed)
Message left that I was trying to reach him about his SIBO test again.  MyChart message sent.

## 2020-10-18 NOTE — Telephone Encounter (Signed)
Hey Dr. Carlean Purl,   Patient returned your call. States the best contact number is [phone]

## 2020-10-20 ENCOUNTER — Encounter: Payer: Self-pay | Admitting: Internal Medicine

## 2020-10-20 DIAGNOSIS — K6389 Other specified diseases of intestine: Secondary | ICD-10-CM | POA: Insufficient documentation

## 2020-10-20 NOTE — Telephone Encounter (Signed)
Reviewed SIBO results He is better at this point especially w/ good fiber (kale smoothy) and other foods and will work on diet
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
## 2020-12-07 IMAGING — DX DG CLAVICLE*R*
2 series · 2 of 2 positions shown · non-contrast
Comparison: None.

CLINICAL DATA: Fall with shoulder pain.

EXAM:
RIGHT CLAVICLE - 2+ VIEWS

[clavicle ap]
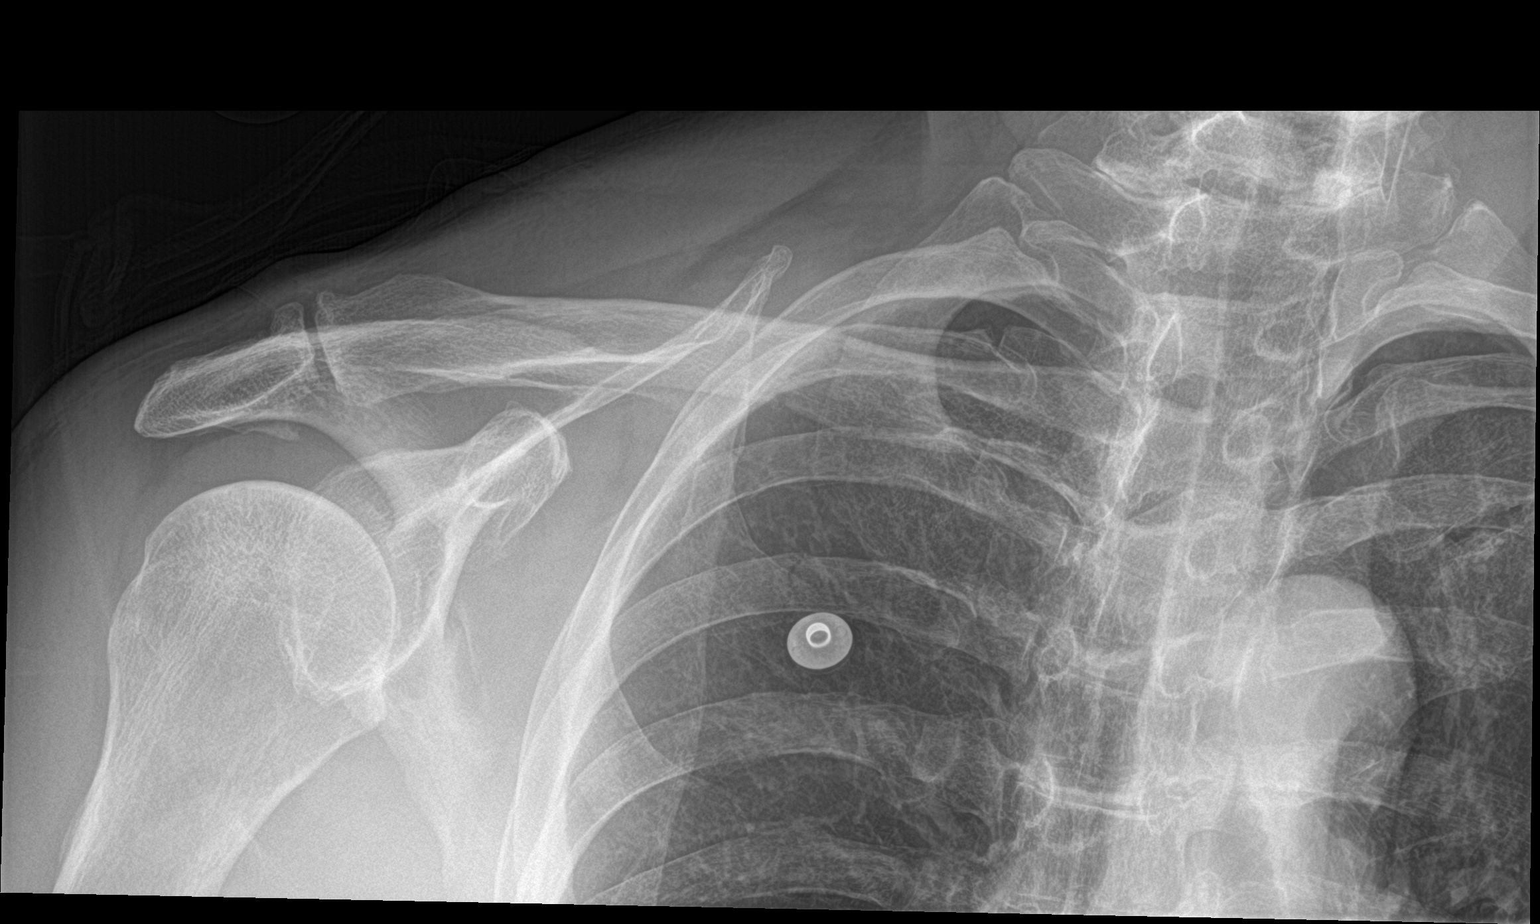

[clavicle axial]
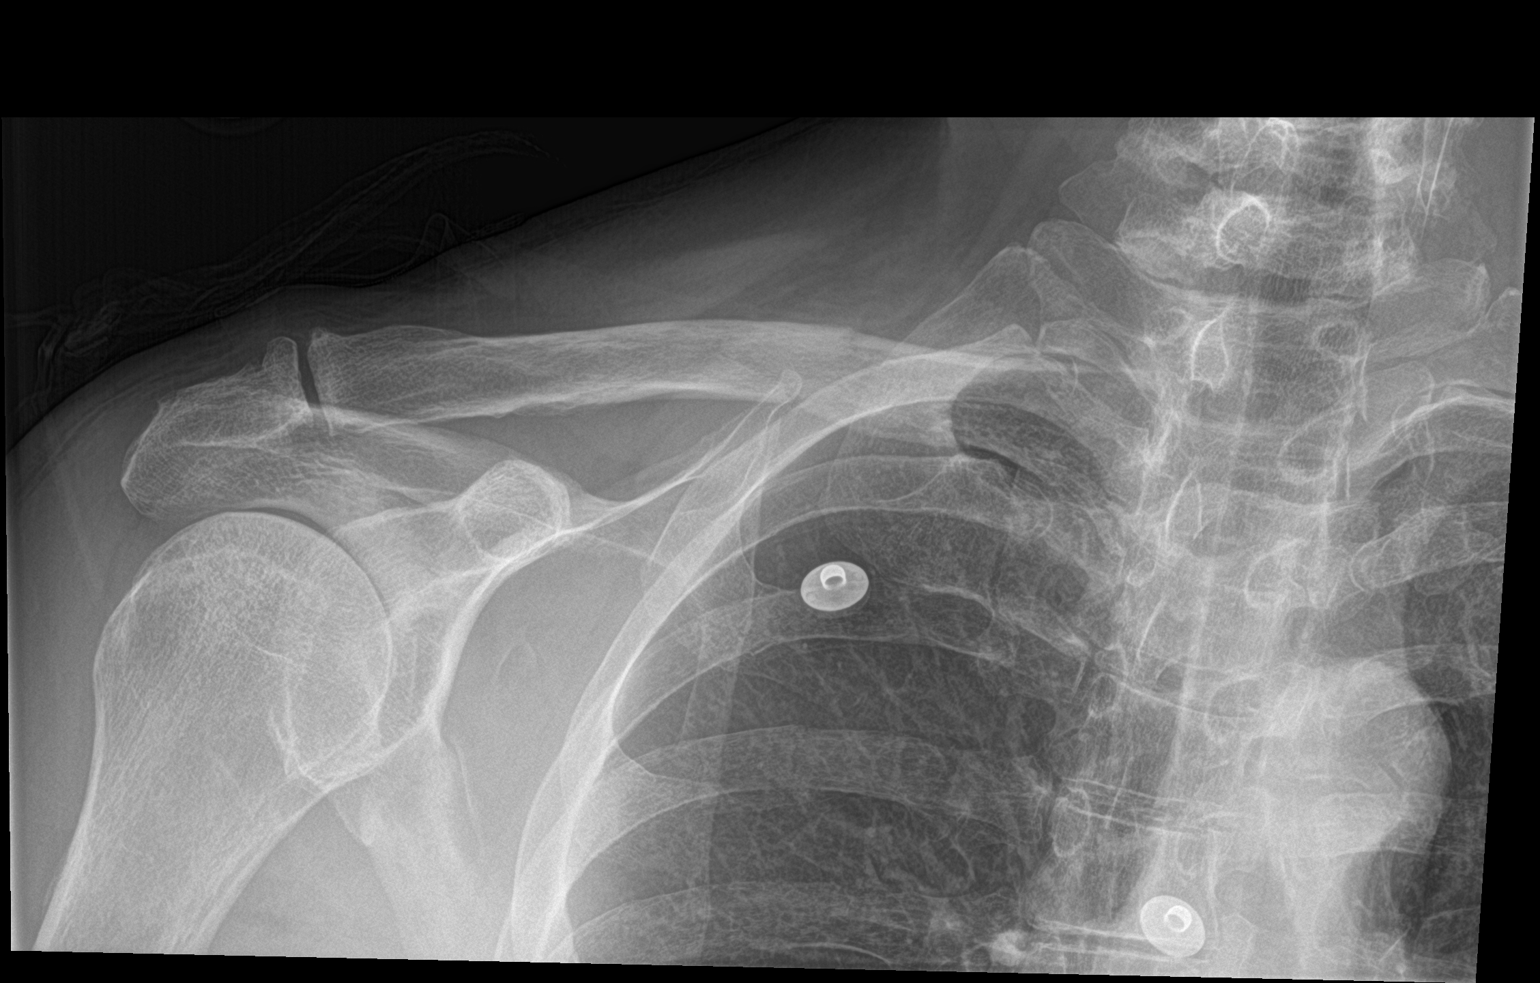

[2 of 2 positions shown; findings below may reference images not displayed]

FINDINGS: There is a nondisplaced fracture of the midshaft of the right
clavicle. The acromioclavicular joint is preserved.
IMPRESSION: Nondisplaced fracture of the midshaft of the right clavicle.
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
## 2021-03-02 ENCOUNTER — Ambulatory Visit (INDEPENDENT_AMBULATORY_CARE_PROVIDER_SITE_OTHER): Payer: Managed Care, Other (non HMO) | Admitting: Sports Medicine

## 2021-03-02 ENCOUNTER — Other Ambulatory Visit: Payer: Self-pay

## 2021-03-02 VITALS — Ht 69.0 in | Wt 155.0 lb

## 2021-03-02 DIAGNOSIS — S448X1A Injury of other nerves at shoulder and upper arm level, right arm, initial encounter: Secondary | ICD-10-CM | POA: Insufficient documentation

## 2021-03-02 MED ORDER — AMITRIPTYLINE HCL 25 MG PO TABS: 25.0000 mg | ORAL_TABLET | Freq: Every day | ORAL | 1 refills | Status: DC
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
## 2021-03-02 NOTE — Patient Instructions (Signed)
Thank you for coming to see me today. It was a pleasure. Today we talked about:   Do the scapular retraction exercises with 3-5 lbs.  You should do these at least 5 times a week.  Do the T and I stretches as well.  We will start you on amitriptyline 25 mg at night.  Watch for difficulty urinating.  If this happens, stop the medication and let us know immediately.  Please follow-up with [place] in 4 weeks.  If you have any questions or concerns, please do not hesitate to call the office at [phone].  Best,   Mason Constable, DO [place]
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
## 2021-03-25 ENCOUNTER — Other Ambulatory Visit: Payer: Self-pay | Admitting: Family Medicine

## 2021-03-25 DIAGNOSIS — S448X1A Injury of other nerves at shoulder and upper arm level, right arm, initial encounter: Secondary | ICD-10-CM

## 2021-03-29 ENCOUNTER — Ambulatory Visit: Payer: Managed Care, Other (non HMO) | Admitting: Family Medicine
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
## 2021-04-05 ENCOUNTER — Ambulatory Visit (INDEPENDENT_AMBULATORY_CARE_PROVIDER_SITE_OTHER): Payer: Managed Care, Other (non HMO) | Admitting: Family Medicine

## 2021-04-05 VITALS — BP 118/84 | Ht 69.0 in | Wt 157.0 lb

## 2021-04-05 DIAGNOSIS — S448X1A Injury of other nerves at shoulder and upper arm level, right arm, initial encounter: Secondary | ICD-10-CM

## 2021-04-05 DIAGNOSIS — G5761 Lesion of plantar nerve, right lower limb: Secondary | ICD-10-CM | POA: Diagnosis not present

## 2021-04-05 NOTE — Patient Instructions (Signed)
Thank you for coming to see me today. It was a pleasure. Today we talked about:   Continue the exercises that we gave you last time and try to alternate them with the [facility] Flexion exercises and core strengthening.  Take breaks while working and do the T and I positioning, but not as an exercise.  For your Morton's neuroma, try the pad that we have given you in your shoe.  The amitriptyline will also help with this.  Please follow-up with [place] in 6 weeks.  If you have any questions or concerns, please do not hesitate to call the office at [phone].  Best,   [name], DO [place]

## 2021-04-05 NOTE — Assessment & Plan Note (Signed)
Given his rather significant injury and the chronicity of his symptoms with significant atrophy, discussed that this may take quite some time to improve.  Continue with exercises, suspect that he is having some core weakness with change in how he is sitting due to the weakness related to his injury.  We will add Williams flexion exercises and core strengthening.  Advised that he should stop during the day while working and perform I and T stretches to help reset his alignment, but not as exercise.  Advised that he should alternate prior home exercise program with the addition of Williams flexion and core exercises.  Continue amitriptyline, he will wait on increasing the dose currently.  Follow-up in 6 weeks.
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
## 2021-04-05 NOTE — Assessment & Plan Note (Signed)
Discussed that amitriptyline can be helpful for this as well.  Given neuroma pads, he would like to hold off on sports insoles or custom orthotics.  Could consider this in the future.

## 2021-05-17 ENCOUNTER — Ambulatory Visit: Payer: Managed Care, Other (non HMO) | Admitting: Family Medicine

## 2021-05-23 ENCOUNTER — Ambulatory Visit (INDEPENDENT_AMBULATORY_CARE_PROVIDER_SITE_OTHER): Payer: Managed Care, Other (non HMO) | Admitting: Internal Medicine

## 2021-05-23 ENCOUNTER — Encounter: Payer: Self-pay | Admitting: Internal Medicine

## 2021-05-23 VITALS — BP 108/78 | HR 72 | Ht 69.0 in | Wt 160.2 lb

## 2021-05-23 DIAGNOSIS — K580 Irritable bowel syndrome with diarrhea: Secondary | ICD-10-CM

## 2021-05-23 DIAGNOSIS — K6389 Other specified diseases of intestine: Secondary | ICD-10-CM

## 2021-05-23 MED ORDER — RIFAXIMIN 550 MG PO TABS: 550.0000 mg | ORAL_TABLET | Freq: Three times a day (TID) | ORAL | 0 refills | Status: AC

## 2021-05-23 NOTE — Patient Instructions (Signed)
If you are age 63 or older, your body mass index should be between 23-30. Your Body mass index is 23.66 kg/m. If this is out of the aforementioned range listed, please consider follow up with your Primary Care Provider.  If you are age 9 or younger, your body mass index should be between 19-25. Your Body mass index is 23.66 kg/m. If this is out of the aformentioned range listed, please consider follow up with your Primary Care Provider.   ________________________________________________________  The [facility] providers would like to encourage you to use [facility] to communicate with providers for non-urgent requests or questions.  Due to long hold times on the telephone, sending your provider a message by [facility] may be a faster and more efficient way to get a response.  Please allow 48 business hours for a response.  Please remember that this is for non-urgent requests.  _______________________________________________________  We have sent the following medications to your pharmacy for you to pick up at your convenience: Xifaxan, call us if it cost too much  I appreciate the opportunity to care for you. Silvano Rusk, MD, [facility]
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
## 2021-05-24 ENCOUNTER — Ambulatory Visit (INDEPENDENT_AMBULATORY_CARE_PROVIDER_SITE_OTHER): Payer: Managed Care, Other (non HMO) | Admitting: Family Medicine

## 2021-05-24 DIAGNOSIS — S448X1A Injury of other nerves at shoulder and upper arm level, right arm, initial encounter: Secondary | ICD-10-CM

## 2021-05-24 MED ORDER — AMITRIPTYLINE HCL 25 MG PO TABS: ORAL_TABLET | ORAL | 1 refills | Status: DC

## 2021-05-24 NOTE — Progress Notes (Signed)
° °  Mason Irwin is a 65 y.o. male who presents to [facility] today for the following:  Right suprascapular nerve injury Follow-up, last seen for the same on 9/22 and 10/26 Has been doing home exercises, but since last visit has not been as diligent with this Also been taking amitriptyline at 25 mg daily,has not had any side effects Thinks that is helps some Overall is having some improvement Has been riding his bike more and thinks that this is helping as well   Right Morton's neuroma Given neuroma pads on 10/26 Has been working on decreasing pressure in his forefoot and this has helped Not much pain anymore  PMH reviewed.  ROS as above. Medications reviewed.  Exam:  BP 112/78    Ht 5\' 9"  (1.753 m)    Wt 160 lb (72.6 kg)    BMI 23.63 kg/m  Gen: Well NAD MSK:  Thoracic spine Inspection: Continues to have scapular winging on the right compared to the left with some atrophy of the paraspinal musculature on the right, however he does appear to have some increased tone within this paraspinal musculature today  No results found.   Assessment and Plan: 1) Injury of right suprascapular nerve Slightly improved, however he is not happy with the amount of time that he has been spending on his home exercise regimen.  He plans to work on this more over the next 2 to 3 months and will follow-up at that time.  He did specifically ask about incorporating new exercises to his regimen, advised that as long as he is not having pain that this would be safe, however would still focus on his T and I stretches, Williams flexion exercises, and core strengthening.  Elavil refilled, advised that he can increase this to 50 mg if he is able and finds this helpful to help with his nerve injury.   [name], D.O.  PGY-4 [facility]  05/24/2021 4:18 PM  Addendum:  I was the preceptor for this visit and available for immediate consultation.  [name] MD [facility] Boys

## 2021-05-24 NOTE — Patient Instructions (Signed)
Thank you for coming to see me today. It was a pleasure. Today we talked about:   Continue your exercises.  You can try  increase your amitriptyline if you can tolerate it and find it helpful.  Please follow-up with [place] in 2-3 months.  If you have any questions or concerns, please do not hesitate to call the office at [phone].  Best,   [name], DO [place]

## 2021-05-24 NOTE — Assessment & Plan Note (Signed)
Slightly improved, however he is not happy with the amount of time that he has been spending on his home exercise regimen.  He plans to work on this more over the next 2 to 3 months and will follow-up at that time.  He did specifically ask about incorporating new exercises to his regimen, advised that as long as he is not having pain that this would be safe, however would still focus on his T and I stretches, Williams flexion exercises, and core strengthening.  Elavil refilled, advised that he can increase this to 50 mg if he is able and finds this helpful to help with his nerve injury.

## 2021-06-16 ENCOUNTER — Encounter: Payer: Self-pay | Admitting: Internal Medicine

## 2021-08-09 ENCOUNTER — Ambulatory Visit (INDEPENDENT_AMBULATORY_CARE_PROVIDER_SITE_OTHER): Payer: Managed Care, Other (non HMO) | Admitting: Family Medicine

## 2021-08-09 DIAGNOSIS — S448X1A Injury of other nerves at shoulder and upper arm level, right arm, initial encounter: Secondary | ICD-10-CM | POA: Diagnosis not present

## 2021-08-09 MED ORDER — AMITRIPTYLINE HCL 25 MG PO TABS: ORAL_TABLET | ORAL | 1 refills | Status: DC

## 2021-08-09 NOTE — Progress Notes (Signed)
? ?  Mason Irwin is a 66 y.o. male who presents to [facility] today for the following: ? ?Right suprascapular nerve injury ?Last seen for the same on 12/14 ?Currently taking amitriptyline 25mg -50mg  of amitriptyline at night and does well with this ?Has been noticing improvement in his pain throughout the day and is able to sit at his desk without significant pain ?Can also ride his bike without issue ?Overall very happy with his progress and feels like he is getting his drive back ?Has modified his exercises to push himself a little more and thinks that this has been helpful for him ?It has been hard to do the entire series of HEP we have given due to time constraints, but he feels like the exercises he is able to do have helped and he is very diligent about doing them with proper form ? ?PMH reviewed.  ?ROS as above. ?Medications reviewed. ? ?Exam:  ?BP (!) 107/35   Pulse 84   Ht 5\' 9"  (1.753 m)   Wt 165 lb (74.8 kg)   BMI 24.37 kg/m?  ?Gen: Well NAD ?MSK: ? ?Thoracic Spine/Scapula on right ?At rest, he has slight winging of the right scapula, but with ROM of right arm, specifically when demonstrating the form he uses for pull ups, he is able to completely take away this winging and periscapular musculature is significantly stronger than previous exams ? ?No results found. ? ? ?Assessment and Plan: ?1) Injury of right suprascapular nerve ?Overall I am incredibly happy with the progress that he has made in regards to his strength of his parascapular musculature and pain throughout the day.  Also very happy to hear that patient has had such an improvement in his overall life since noting improvement with this.  Congratulated him on this.  Recommend continue home exercises.  Refill provided for amitriptyline.  Did discuss that he could titrate up if he found this helpful, max dose of 150 mg per night.  Follow-up here as needed. ? ? ?[name], D.O.  ?PGY-4 [facility]  ?08/09/2021 4:55 PM ? ?I  was the preceptor for this visit and available for immediate consultation ?Shellia Cleverly, DO ?

## 2021-08-09 NOTE — Assessment & Plan Note (Signed)
Overall I am incredibly happy with the progress that he has made in regards to his strength of his parascapular musculature and pain throughout the day.  Also very happy to hear that patient has had such an improvement in his overall life since noting improvement with this.  Congratulated him on this.  Recommend continue home exercises.  Refill provided for amitriptyline.  Did discuss that he could titrate up if he found this helpful, max dose of 150 mg per night.  Follow-up here as needed. ?

## 2021-11-01 ENCOUNTER — Telehealth: Payer: Self-pay

## 2021-11-01 NOTE — Telephone Encounter (Signed)
NOTES SCANNED TO REFERRAL 

## 2021-12-01 ENCOUNTER — Encounter: Payer: Self-pay | Admitting: Internal Medicine

## 2021-12-01 ENCOUNTER — Ambulatory Visit (INDEPENDENT_AMBULATORY_CARE_PROVIDER_SITE_OTHER): Payer: Managed Care, Other (non HMO) | Admitting: Internal Medicine

## 2021-12-01 VITALS — BP 120/60 | HR 58 | Resp 11 | Wt 164.6 lb

## 2021-12-01 DIAGNOSIS — R002 Palpitations: Secondary | ICD-10-CM

## 2021-12-13 ENCOUNTER — Ambulatory Visit (INDEPENDENT_AMBULATORY_CARE_PROVIDER_SITE_OTHER): Payer: Managed Care, Other (non HMO)

## 2021-12-13 ENCOUNTER — Ambulatory Visit (HOSPITAL_BASED_OUTPATIENT_CLINIC_OR_DEPARTMENT_OTHER)
Admission: RE | Admit: 2021-12-13 | Discharge: 2021-12-13 | Disposition: A | Discharge status: Home / Self Care | Payer: Managed Care, Other (non HMO) | Source: Ambulatory Visit | Attending: Internal Medicine | Admitting: Internal Medicine

## 2021-12-13 DIAGNOSIS — R002 Palpitations: Secondary | ICD-10-CM

## 2021-12-13 DIAGNOSIS — R072 Precordial pain: Secondary | ICD-10-CM

## 2021-12-13 LAB — ECHOCARDIOGRAM COMPLETE
AR max vel: 2.37 cm2
AV Area VTI: 2.44 cm2
AV Area mean vel: 2.06 cm2
AV Mean grad: 4 mmHg
AV Peak grad: 6.7 mmHg
Ao pk vel: 1.29 m/s
Area-P 1/2: 3.95 cm2
Calc EF: 58.4 %
MV M vel: 5.04 m/s
MV Peak grad: 101.6 mmHg
S' Lateral: 3.21 cm
Single Plane A2C EF: 61.1 %
Single Plane A4C EF: 52.1 %

## 2022-10-31 LAB — LAB REPORT - SCANNED: EGFR: 89

## 2022-11-01 ENCOUNTER — Other Ambulatory Visit: Payer: Self-pay | Admitting: Family Medicine

## 2022-11-01 DIAGNOSIS — E041 Nontoxic single thyroid nodule: Secondary | ICD-10-CM

## 2022-11-23 ENCOUNTER — Ambulatory Visit
Admission: RE | Admit: 2022-11-23 | Discharge: 2022-11-23 | Disposition: A | Discharge status: Home / Self Care | Payer: Medicare Other | Source: Ambulatory Visit | Attending: Family Medicine | Admitting: Family Medicine

## 2022-11-23 DIAGNOSIS — E041 Nontoxic single thyroid nodule: Secondary | ICD-10-CM

## 2022-12-10 ENCOUNTER — Other Ambulatory Visit: Payer: Managed Care, Other (non HMO)

## 2022-12-25 ENCOUNTER — Ambulatory Visit: Payer: Managed Care, Other (non HMO) | Admitting: Internal Medicine

## 2023-01-24 NOTE — Therapy (Signed)
OUTPATIENT PHYSICAL THERAPY THORACOLUMBAR EVALUATION   Patient Name: Mason Irwin MRN: [id number] DOB:1955/12/09, 67 y.o., male Today's Date: 01/25/2023  END OF SESSION:  PT End of Session - 01/25/23 0800     Visit Number 1    Date for PT Re-Evaluation 03/22/23    Authorization Type MCR    Progress Note Due on Visit 10    PT Start Time 0800    PT Stop Time 0848    PT Time Calculation (min) 48 min    Activity Tolerance Patient tolerated treatment well    Behavior During Therapy WFL for tasks assessed/performed             Past Medical History:  Diagnosis Date   Allergy    Anxiety    Eczema    hands, chronic and recurrent   Family history of prostate cancer in father    FH: early coronary artery disease    Hemorrhoids    Hyperlipidemia    Hyperplastic colon polyp    Pure hypercholesterolemia    S/P appendectomy    S/P bilateral inguinal hernia repair    46 weeks old    Small intestinal bacterial overgrowth (SIBO)    Past Surgical History:  Procedure Laterality Date   APPENDECTOMY     age 49 weeks   COLONOSCOPY     INGUINAL HERNIA REPAIR Bilateral    with appendectomy, age 41 weeks   LUMBAR DISC SURGERY     L4-L5   Patient Active Problem List   Diagnosis Date Noted   Morton's neuroma, right 04/05/2021   Injury of right suprascapular nerve 03/02/2021   Small intestinal bacterial overgrowth (SIBO) 10/20/2020   Fracture of shaft of clavicle, closed 01/14/2020   Routine general medical examination at a health care facility 09/29/2013   Low back pain 10/04/2011   HSV infection 07/10/2011   DERMATITIS, CONTACT, NOS 03/17/2007    PCP: Farris Has, MD   REFERRING PROVIDER: Dawley, Alan Mulder, DO   REFERRING DIAG: M41.9 (ICD-10-CM) - Scoliosis, unspecified   Rationale for Evaluation and Treatment: Rehabilitation  THERAPY DIAG:  Other low back pain - Plan: PT plan of care cert/re-cert  Pain in thoracic spine - Plan: PT plan of care  cert/re-cert  Abnormal posture - Plan: PT plan of care cert/re-cert  ONSET DATE: 2021  SUBJECTIVE:                                                                                                                                                                                           SUBJECTIVE STATEMENT: Pain is in midsection of back and down left side wraps around  toward front. Also has pain at L4/5 and keeps him from straightening up. Had a fall in 2021 and broke ribs and clavicle. Since then pain has worsened.  He has winging of R scapula (one MD thinks it was nerve damage from the fall). LBP - bending, scrubbing, standing up. Mid back is more with sitting. Has been working on squats and Lawyer. Breathing exercises, hanging. Cycles a lot so HS tight. Some lateral R calf numbness from laminectomy.  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 Yes: NPRS scale: 0 today up to 2-4 in midback/lowback /10 Pain location: mid and low back, in b/w scapula and sometimes into neck Pain description: tight, lower back pinch, upper back like I've been whacked Aggravating factors: bending, standing up Relieving factors: meds, massage table, stretching legs  PRECAUTIONS: None  RED FLAGS: None   WEIGHT BEARING RESTRICTIONS: No  FALLS:  Has patient fallen in last 6 months? No  LIVING ENVIRONMENT: Lives with: lives with their spouse Lives in: House/apartment  OCCUPATION: retired, cyclist  PLOF: Independent  PATIENT GOALS: reduce the pain  NEXT MD VISIT: none scheduled  OBJECTIVE:   DIAGNOSTIC FINDINGS:  03/02/20 AP lateral of the thoracic spine shows normal kyphotic curvature without  any evidence of compression fracture.  There are multilevel degenerative  disc changes mild.  He does have significant cervical thoracic lumbar  scoliosis particularly in the thoracolumbar region  where he has a  rightward convex curvature with rotatory component.  He has compensated  curve with fairly straight thoracic spine but begins to curve again going  into the cervical area.   Cervical AP and lateral shows normal lordotic curvature without listhesis.   There is some facet degenerative change in the upper cervical as well as  medical cervical area but nothing really high-grade.  Some uncovertebral  joint hypertrophy at C5-6.  There is mild compensatory scoliosis with a  rightward lean.   PATIENT SURVEYS:  FOTO 58 (61 predicted)  COGNITION: Overall cognitive status: Within functional limits for tasks assessed     SENSATION: Reports some numbness in R lateral calf since laminectomy  MUSCLE LENGTH: Marked HS, pirformis R>L, ITB, quads, L HF  POSTURE: forward head, posterior pelvic tilt, and R thoracic convexity scoliosis, flexed hips/knees, decreased cervical lordosis  PALPATION: HS insertion B, some tightness in left diaphragm compared to R (likely from scoliosis), Decreased lumbar/thoracic spinal mobility but no pain reported.  LUMBAR ROM:   AROM eval  Flexion Hands to just below knees*  Extension 10 flexes knees*  Right lateral flexion 10*  Left lateral flexion 10*  Right rotation 75%  Left rotation 60%   (Blank rows = not tested)  LOWER EXTREMITY MMT:     MMT  Right eval Left eval  Hip flexion 4+ 4+  Hip extension 5 5  Hip abduction 5 5  Hip adduction 5 5  Hip internal rotation    Hip external rotation    Knee flexion 5 5  Knee extension 5 5  Ankle dorsiflexion 5 5   (Blank rows = not tested)  LOWER EXTREMITY ROM:  B Knee extension limited by tight HS, else [facility]    TODAY'S TREATMENT:  DATE:   01/25/23 See pt ed and HEP    PATIENT EDUCATION:  Education details: PT eval findings, anticipated POC, and initial  HEP  Person educated: Patient Education method: Explanation, Demonstration, and Handouts Education comprehension: verbalized understanding and returned demonstration  HOME EXERCISE PROGRAM: Access Code: [email or web address] URL: [email or web address] Date: 01/25/2023 Prepared by: Raynelle Fanning  Exercises - Supine ITB Stretch with Strap  - 2 x daily - 7 x weekly - 1 sets - 3 reps - 60 sec hold - Supine Quadriceps Stretch with Strap on Table  - 2 x daily - 7 x weekly - 1 sets - 3 reps - 30-60 sec hold - Seated Hamstring Stretch  - 2 x daily - 7 x weekly - 1 sets - 3 reps - 30-60 sec hold  ASSESSMENT:  CLINICAL IMPRESSION: Usman A Boyajian is a 67 y.o. male who was seen today for physical therapy evaluation and treatment for chronic low and mid back pain. His back pain started in his twenties, but has worsened since a fall in 2021 when he fell and broke three ribs and his clavicle. This is when his mid-back pain started as well.  He demonstrates postural deficits, marked limitations in lumbar ROM and hip and leg flexibility contributing to pain and core weakness. He reports he doesn't feel like he breathes as deeply since his broken ribs. He will benefit from skilled PT to address these deficits.    OBJECTIVE IMPAIRMENTS: decreased activity tolerance, decreased ROM, decreased strength, hypomobility, increased muscle spasms, impaired flexibility, postural dysfunction, and pain.   ACTIVITY LIMITATIONS: bending, sitting, and squatting  PARTICIPATION LIMITATIONS: cleaning  PERSONAL FACTORS: Age and Time since onset of injury/illness/exacerbation are also affecting patient's functional outcome.   REHAB POTENTIAL: Good  CLINICAL DECISION MAKING: Evolving/moderate complexity  EVALUATION COMPLEXITY: Moderate   GOALS: Goals reviewed with patient? Yes  SHORT TERM GOALS: Target date: 02/22/2023   Patient will be independent with initial HEP.  Baseline:  Goal status: INITIAL  2.  Decreased  mid and low back pain by 30% with ADLS Baseline:  Goal status: INITIAL   LONG TERM GOALS: Target date: 03/22/2023   Patient will be independent with advanced/ongoing HEP to improve outcomes and carryover.  Baseline:  Goal status: INITIAL  2.  Patient will report 75% improvement in low back pain with ADLs and sit to stand transfer to improve QOL.  Baseline:  Goal status: INITIAL  3.  Patient will demonstrate functional pain free ROM to perform ADLs and to check blind spots while biking.   Baseline:  Goal status: INITIAL  4.  Patient will demonstrate improved core strength to 4+/5 to stabilize posture. Baseline:  Goal status: INITIAL  5.  Patient will report 37 on lumbar FOTO to demonstrate improved functional ability.  Baseline: 58 Goal status: INITIAL   6.  Patient to demonstrate ability to achieve and maintain good spinal alignment/posturing and body mechanics needed for daily activities. Baseline:  Goal status: INITIAL  8. Patient will demonstrate improved HS and hip flexor flexibility to allow for neutral spine in standing. Baseline:  Goal status: INITIAL   PLAN:  PT FREQUENCY: 2x/week  PT DURATION: 8 weeks  PLANNED INTERVENTIONS: Therapeutic exercises, Therapeutic activity, Neuromuscular re-education, Balance training, Patient/Family education, Self Care, Joint mobilization, Aquatic Therapy, Dry Needling, Electrical stimulation, Spinal mobilization, Cryotherapy, Moist heat, and Manual therapy.  PLAN FOR NEXT SESSION: Focus on flexibility; Add thoracic mobility, hip flexor/quad and abdominal flexibility to HEP, Work on diaphragmatic breathing and rib mobility, core and  postural strengthening.    Solon Palm, PT  01/25/2023, 12:31 PM

## 2023-01-25 ENCOUNTER — Ambulatory Visit: Payer: Medicare Other | Attending: Neurological Surgery | Admitting: Physical Therapy

## 2023-01-25 ENCOUNTER — Other Ambulatory Visit: Payer: Self-pay

## 2023-01-25 ENCOUNTER — Encounter: Payer: Self-pay | Admitting: Physical Therapy

## 2023-01-25 DIAGNOSIS — M5459 Other low back pain: Secondary | ICD-10-CM | POA: Insufficient documentation

## 2023-01-25 DIAGNOSIS — R293 Abnormal posture: Secondary | ICD-10-CM | POA: Diagnosis present

## 2023-01-25 DIAGNOSIS — M546 Pain in thoracic spine: Secondary | ICD-10-CM | POA: Diagnosis present

## 2023-01-28 ENCOUNTER — Encounter: Payer: Self-pay | Admitting: Physical Therapy

## 2023-01-28 ENCOUNTER — Ambulatory Visit: Payer: Medicare Other | Admitting: Physical Therapy

## 2023-01-28 DIAGNOSIS — M5459 Other low back pain: Secondary | ICD-10-CM | POA: Diagnosis not present

## 2023-01-28 DIAGNOSIS — R293 Abnormal posture: Secondary | ICD-10-CM

## 2023-01-28 DIAGNOSIS — M546 Pain in thoracic spine: Secondary | ICD-10-CM

## 2023-01-28 NOTE — Therapy (Signed)
OUTPATIENT PHYSICAL THERAPY THORACOLUMBAR TREATMENT   Patient Name: Mason Irwin MRN: [id number] DOB:10-Apr-1956, 67 y.o., male Today's Date: 01/28/2023  END OF SESSION:  PT End of Session - 01/28/23 0928     Visit Number 2    Date for PT Re-Evaluation 03/22/23    Authorization Type MCR    Progress Note Due on Visit 10    PT Start Time 0928    PT Stop Time 1017    PT Time Calculation (min) 49 min    Activity Tolerance Patient tolerated treatment well    Behavior During Therapy WFL for tasks assessed/performed              Past Medical History:  Diagnosis Date   Allergy    Anxiety    Eczema    hands, chronic and recurrent   Family history of prostate cancer in father    FH: early coronary artery disease    Hemorrhoids    Hyperlipidemia    Hyperplastic colon polyp    Pure hypercholesterolemia    S/P appendectomy    S/P bilateral inguinal hernia repair    47 weeks old    Small intestinal bacterial overgrowth (SIBO)    Past Surgical History:  Procedure Laterality Date   APPENDECTOMY     age 9 weeks   COLONOSCOPY     INGUINAL HERNIA REPAIR Bilateral    with appendectomy, age 96 weeks   LUMBAR DISC SURGERY     L4-L5   Patient Active Problem List   Diagnosis Date Noted   Morton's neuroma, right 04/05/2021   Injury of right suprascapular nerve 03/02/2021   Small intestinal bacterial overgrowth (SIBO) 10/20/2020   Fracture of shaft of clavicle, closed 01/14/2020   Routine general medical examination at a health care facility 09/29/2013   Low back pain 10/04/2011   HSV infection 07/10/2011   DERMATITIS, CONTACT, NOS 03/17/2007    PCP: Farris Has, MD   REFERRING PROVIDER: Dawley, Alan Mulder, DO   REFERRING DIAG: M41.9 (ICD-10-CM) - Scoliosis, unspecified   Rationale for Evaluation and Treatment: Rehabilitation  THERAPY DIAG:  Other low back pain  Pain in thoracic spine  Abnormal posture  ONSET DATE: 2021  SUBJECTIVE:                                                                                                                                                                                            SUBJECTIVE STATEMENT: I stretched 2 hours after cycling and took Tramadol keeping knee straight.  I varied my angle.  Over the weekend it was more lower back pain and this morning the pain  between the scapula is there - a 6/10.  I have been doing yoga for a weeks now.   Eval: Pain is in midsection of back and down left side wraps around toward front. Also has pain at L4/5 and keeps him from straightening up. Had a fall in 2021 and broke ribs and clavicle. Since then pain has worsened.  He has winging of R scapula (one MD thinks it was nerve damage from the fall). LBP - bending, scrubbing, standing up. Mid back is more with sitting. Has been working on squats and Lawyer. Breathing exercises, hanging. Cycles a lot so HS tight. Some lateral R calf numbness from laminectomy.  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 Yes: NPRS scale: 0 today up to 4in midback/lowback /10 Pain location: mid and low back, in b/w scapula and sometimes into neck Pain description: tight, lower back pinch, upper back like I've been whacked Aggravating factors: bending, standing up Relieving factors: meds, massage table, stretching legs  PRECAUTIONS: None  RED FLAGS: None   WEIGHT BEARING RESTRICTIONS: No  FALLS:  Has patient fallen in last 6 months? No  LIVING ENVIRONMENT: Lives with: lives with their spouse Lives in: House/apartment  OCCUPATION: retired, cyclist  PLOF: Independent  PATIENT GOALS: reduce the pain  NEXT MD VISIT: none scheduled  OBJECTIVE:   DIAGNOSTIC FINDINGS:  03/02/20 AP lateral of the thoracic spine shows normal kyphotic curvature without  any evidence of compression fracture.  There are multilevel degenerative   disc changes mild.  He does have significant cervical thoracic lumbar  scoliosis particularly in the thoracolumbar region where he has a  rightward convex curvature with rotatory component.  He has compensated  curve with fairly straight thoracic spine but begins to curve again going  into the cervical area.   Cervical AP and lateral shows normal lordotic curvature without listhesis.   There is some facet degenerative change in the upper cervical as well as  medical cervical area but nothing really high-grade.  Some uncovertebral  joint hypertrophy at C5-6.  There is mild compensatory scoliosis with a  rightward lean.   PATIENT SURVEYS:  FOTO 58 (61 predicted)  COGNITION: Overall cognitive status: Within functional limits for tasks assessed     SENSATION: Reports some numbness in R lateral calf since laminectomy  MUSCLE LENGTH: Marked HS, pirformis R>L, ITB, quads, L HF  POSTURE: forward head, posterior pelvic tilt, and R thoracic convexity scoliosis, flexed hips/knees, decreased cervical lordosis  PALPATION: HS insertion B, some tightness in left diaphragm compared to R (likely from scoliosis), Decreased lumbar/thoracic spinal mobility but no pain reported.  LUMBAR ROM:   AROM eval  Flexion Hands to just below knees*  Extension 10 flexes knees*  Right lateral flexion 10*  Left lateral flexion 10*  Right rotation 75%  Left rotation 60%   (Blank rows = not tested)  LOWER EXTREMITY MMT:     MMT  Right eval Left eval  Hip flexion 4+ 4+  Hip extension 5 5  Hip abduction 5 5  Hip adduction 5 5  Hip internal rotation    Hip external rotation    Knee flexion 5 5  Knee extension 5 5  Ankle dorsiflexion 5 5   (Blank rows = not tested)  LOWER EXTREMITY ROM:  B Knee extension limited by tight HS, else [facility]    TODAY'S TREATMENT:  DATE:    01/28/23: NuStep L7 x 4' PT present to discuss status Seated HS stretch 2x30" bil Quad stretch kneeling on foam, transition to hip flexor stretch with OH reach x 30" each Trial of hip flexor stretch standing in doorframe with OH reach along doorframe x 30" each Laying on vertical foam roller - diaphragmatic and ribcage expansion breathing, add Y/W/T arms with inhale exhale x 3 rounds Horiz abduction and diagonals UE green tband laying on vertical foam roller 5-10 each SL open book 3 rounds with 3 deep ribcage expansion breaths at end range, then 3 rounds just reps Option to do HS stretch with foot on bottom step and holding rails to allow for more gentle stretch to avoid pain guarding Prone manual therapy: lumbar and thoracic PA, rib springing bil  01/25/23 See pt ed and HEP    PATIENT EDUCATION:  Education details: PT eval findings, anticipated POC, and initial HEP  Person educated: Patient Education method: Explanation, Demonstration, and Handouts Education comprehension: verbalized understanding and returned demonstration  HOME EXERCISE PROGRAM: Access Code: [email or web address] URL: [email or web address] Date: 01/25/2023 Prepared by: Raynelle Fanning  Exercises - Supine ITB Stretch with Strap  - 2 x daily - 7 x weekly - 1 sets - 3 reps - 60 sec hold - Supine Quadriceps Stretch with Strap on Table  - 2 x daily - 7 x weekly - 1 sets - 3 reps - 30-60 sec hold - Seated Hamstring Stretch  - 2 x daily - 7 x weekly - 1 sets - 3 reps - 30-60 sec hold  ASSESSMENT:  CLINICAL IMPRESSION: Pt has been compliant with LE stretching.  We discovered today that he is most comfortable getting into HS stretch from standing with foot on lower step using UE rails.  He otherwise feels so much strain that he tightens up more.  We discussed how stretches need to stay within comfort level so body doesn't guard.  Spine ROM with emphasis on ribcage expansion breathing progressed today with good tolerance.  Pt is  very stiff throughout thoracic and lumbar spine and responded well to prone PA mobs.  He may benefit from DN to bil hamstrings and lumbar/thoracic spine given chronic nature of tightness.   OBJECTIVE IMPAIRMENTS: decreased activity tolerance, decreased ROM, decreased strength, hypomobility, increased muscle spasms, impaired flexibility, postural dysfunction, and pain.   ACTIVITY LIMITATIONS: bending, sitting, and squatting  PARTICIPATION LIMITATIONS: cleaning  PERSONAL FACTORS: Age and Time since onset of injury/illness/exacerbation are also affecting patient's functional outcome.   REHAB POTENTIAL: Good  CLINICAL DECISION MAKING: Evolving/moderate complexity  EVALUATION COMPLEXITY: Moderate   GOALS: Goals reviewed with patient? Yes  SHORT TERM GOALS: Target date: 02/22/2023   Patient will be independent with initial HEP.  Baseline:  Goal status: ongoing  2.  Decreased mid and low back pain by 30% with ADLS Baseline:  Goal status: INITIAL   LONG TERM GOALS: Target date: 03/22/2023   Patient will be independent with advanced/ongoing HEP to improve outcomes and carryover.  Baseline:  Goal status: INITIAL  2.  Patient will report 75% improvement in low back pain with ADLs and sit to stand transfer to improve QOL.  Baseline:  Goal status: INITIAL  3.  Patient will demonstrate functional pain free ROM to perform ADLs and to check blind spots while biking.   Baseline:  Goal status: INITIAL  4.  Patient will demonstrate improved core strength to 4+/5 to stabilize posture. Baseline:  Goal status: INITIAL  5.  Patient will report 54  on lumbar FOTO to demonstrate improved functional ability.  Baseline: 58 Goal status: INITIAL   6.  Patient to demonstrate ability to achieve and maintain good spinal alignment/posturing and body mechanics needed for daily activities. Baseline:  Goal status: INITIAL  8. Patient will demonstrate improved HS and hip flexor flexibility to  allow for neutral spine in standing. Baseline:  Goal status: INITIAL   PLAN:  PT FREQUENCY: 2x/week  PT DURATION: 8 weeks  PLANNED INTERVENTIONS: Therapeutic exercises, Therapeutic activity, Neuromuscular re-education, Balance training, Patient/Family education, Self Care, Joint mobilization, Aquatic Therapy, Dry Needling, Electrical stimulation, Spinal mobilization, Cryotherapy, Moist heat, and Manual therapy.  PLAN FOR NEXT SESSION: consider adding DN to bil HS, lumbar multifidi, Focus on flexibility; Add thoracic mobility, hip flexor/quad and abdominal flexibility to HEP, Work on diaphragmatic breathing and rib mobility, core and postural strengthening.    Krysten Veronica, PT 01/28/23 1:27 PM

## 2023-02-17 NOTE — Therapy (Signed)
OUTPATIENT PHYSICAL THERAPY THORACOLUMBAR TREATMENT   Patient Name: Mason Irwin MRN: [id number] DOB:1956/03/02, 67 y.o., male Today's Date: 02/18/2023  END OF SESSION:  PT End of Session - 02/18/23 1533     Visit Number 3    Date for PT Re-Evaluation 03/22/23    Authorization Type MCR    PT Start Time 1533    PT Stop Time 1616    PT Time Calculation (min) 43 min    Activity Tolerance Patient tolerated treatment well    Behavior During Therapy WFL for tasks assessed/performed               Past Medical History:  Diagnosis Date   Allergy    Anxiety    Eczema    hands, chronic and recurrent   Family history of prostate cancer in father    FH: early coronary artery disease    Hemorrhoids    Hyperlipidemia    Hyperplastic colon polyp    Pure hypercholesterolemia    S/P appendectomy    S/P bilateral inguinal hernia repair    65 weeks old    Small intestinal bacterial overgrowth (SIBO)    Past Surgical History:  Procedure Laterality Date   APPENDECTOMY     age 38 weeks   COLONOSCOPY     INGUINAL HERNIA REPAIR Bilateral    with appendectomy, age 57 weeks   LUMBAR DISC SURGERY     L4-L5   Patient Active Problem List   Diagnosis Date Noted   Morton's neuroma, right 04/05/2021   Injury of right suprascapular nerve 03/02/2021   Small intestinal bacterial overgrowth (SIBO) 10/20/2020   Fracture of shaft of clavicle, closed 01/14/2020   Routine general medical examination at a health care facility 09/29/2013   Low back pain 10/04/2011   HSV infection 07/10/2011   DERMATITIS, CONTACT, NOS 03/17/2007    PCP: Farris Has, MD   REFERRING PROVIDER: Dawley, Alan Mulder, DO   REFERRING DIAG: M41.9 (ICD-10-CM) - Scoliosis, unspecified   Rationale for Evaluation and Treatment: Rehabilitation  THERAPY DIAG:  Other low back pain  Pain in thoracic spine  Abnormal posture  ONSET DATE: 2021  SUBJECTIVE:                                                                                                                                                                                            SUBJECTIVE STATEMENT: I've been doing yoga. Haven't necessarily been doing all the exercises I've been given. Working in yard Engineer, production and mulch and raking, so couldn't straighten up back after. Went to yoga and now better.  Eval: Pain is in midsection of back and down left side wraps around toward front. Also has pain at L4/5 and keeps him from straightening up. Had a fall in 2021 and broke ribs and clavicle. Since then pain has worsened.  He has winging of R scapula (one MD thinks it was nerve damage from the fall). LBP - bending, scrubbing, standing up. Mid back is more with sitting. Has been working on squats and Lawyer. Breathing exercises, hanging. Cycles a lot so HS tight. Some lateral R calf numbness from laminectomy.  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 Yes: NPRS scale: 0 today up to 4in midback/lowback /10 Pain location: mid and low back, in b/w scapula and sometimes into neck Pain description: tight, lower back pinch, upper back like I've been whacked Aggravating factors: bending, standing up Relieving factors: meds, massage table, stretching legs  PRECAUTIONS: None  RED FLAGS: None   WEIGHT BEARING RESTRICTIONS: No  FALLS:  Has patient fallen in last 6 months? No  LIVING ENVIRONMENT: Lives with: lives with their spouse Lives in: House/apartment  OCCUPATION: retired, cyclist  PLOF: Independent  PATIENT GOALS: reduce the pain  NEXT MD VISIT: none scheduled  OBJECTIVE:   DIAGNOSTIC FINDINGS:  03/02/20 AP lateral of the thoracic spine shows normal kyphotic curvature without  any evidence of compression fracture.  There are multilevel degenerative  disc changes mild.  He does have significant cervical thoracic  lumbar  scoliosis particularly in the thoracolumbar region where he has a  rightward convex curvature with rotatory component.  He has compensated  curve with fairly straight thoracic spine but begins to curve again going  into the cervical area.   Cervical AP and lateral shows normal lordotic curvature without listhesis.   There is some facet degenerative change in the upper cervical as well as  medical cervical area but nothing really high-grade.  Some uncovertebral  joint hypertrophy at C5-6.  There is mild compensatory scoliosis with a  rightward lean.   PATIENT SURVEYS:  FOTO 58 (61 predicted)  COGNITION: Overall cognitive status: Within functional limits for tasks assessed     SENSATION: Reports some numbness in R lateral calf since laminectomy  MUSCLE LENGTH: Marked HS, pirformis R>L, ITB, quads, L HF  POSTURE: forward head, posterior pelvic tilt, and R thoracic convexity scoliosis, flexed hips/knees, decreased cervical lordosis  PALPATION: HS insertion B, some tightness in left diaphragm compared to R (likely from scoliosis), Decreased lumbar/thoracic spinal mobility but no pain reported.  LUMBAR ROM:   AROM eval  Flexion Hands to just below knees*  Extension 10 flexes knees*  Right lateral flexion 10*  Left lateral flexion 10*  Right rotation 75%  Left rotation 60%   (Blank rows = not tested)  LOWER EXTREMITY MMT:     MMT  Right eval Left eval  Hip flexion 4+ 4+  Hip extension 5 5  Hip abduction 5 5  Hip adduction 5 5  Hip internal rotation    Hip external rotation    Knee flexion 5 5  Knee extension 5 5  Ankle dorsiflexion 5 5   (Blank rows = not tested)  LOWER EXTREMITY ROM:  B Knee extension limited by tight HS, else [facility]    TODAY'S TREATMENT:  DATE:   02/18/23 NuStep L7 x 4' PT present to discuss status Standing HS  stretch foot on stp 3x30" bil Hip flexor stretch 2 x 60 sec on stairs. Foam rolling to LE Discussion of Yin Yoga for stretching post cycling Manual: Skilled palpation and monitoring of soft tissues during DN. STM to L gluteals and lateral quads Trigger Point Dry-Needling  Treatment instructions: Expect mild to moderate muscle soreness. S/S of pneumothorax if dry needled over a lung field, and to seek immediate medical attention should they occur. Patient verbalized understanding of these instructions and education. Patient Consent Given: Yes Education handout provided: Yes Muscles treated: L gluteals and L lumbar multifidi Electrical stimulation performed: No Parameters: N/A Treatment response/outcome: Twitch Response Elicited and Palpable Increase in Muscle Length    01/28/23: NuStep L7 x 4' PT present to discuss status Seated HS stretch 2x30" bil Quad stretch kneeling on foam, transition to hip flexor stretch with OH reach x 30" each Trial of hip flexor stretch standing in doorframe with OH reach along doorframe x 30" each Laying on vertical foam roller - diaphragmatic and ribcage expansion breathing, add Y/W/T arms with inhale exhale x 3 rounds Horiz abduction and diagonals UE green tband laying on vertical foam roller 5-10 each SL open book 3 rounds with 3 deep ribcage expansion breaths at end range, then 3 rounds just reps Option to do HS stretch with foot on bottom step and holding rails to allow for more gentle stretch to avoid pain guarding Prone manual therapy: lumbar and thoracic PA, rib springing bil  01/25/23 See pt ed and HEP    PATIENT EDUCATION:  Education details: PT eval findings, anticipated POC, and initial HEP  Person educated: Patient Education method: Explanation, Demonstration, and Handouts Education comprehension: verbalized understanding and returned demonstration  HOME EXERCISE PROGRAM: Access Code: [email or web address] URL:  [email or web address] Date: 02/18/2023 Prepared by: Raynelle Fanning  Exercises - Supine ITB Stretch with Strap  - 2 x daily - 7 x weekly - 1 sets - 3 reps - 60 sec hold - Supine Quadriceps Stretch with Strap on Table  - 2 x daily - 7 x weekly - 1 sets - 3 reps - 30-60 sec hold - Seated Hamstring Stretch  - 2 x daily - 7 x weekly - 1 sets - 3 reps - 30-60 sec hold  Patient Education - Trigger Point Dry Needling  ASSESSMENT:  CLINICAL IMPRESSION: Mason Irwin continues to report back pain with ADLS. He is not compliant with HEP, but does similar stretches during yoga. Encouraged pt to be more compliant and perhaps try Yin Yoga as an alternative. Initial trial of DN went well today with good twitch responses in gluteals and left lumbar multifidi.   OBJECTIVE IMPAIRMENTS: decreased activity tolerance, decreased ROM, decreased strength, hypomobility, increased muscle spasms, impaired flexibility, postural dysfunction, and pain.   ACTIVITY LIMITATIONS: bending, sitting, and squatting  PARTICIPATION LIMITATIONS: cleaning  PERSONAL FACTORS: Age and Time since onset of injury/illness/exacerbation are also affecting patient's functional outcome.   REHAB POTENTIAL: Good  CLINICAL DECISION MAKING: Evolving/moderate complexity  EVALUATION COMPLEXITY: Moderate   GOALS: Goals reviewed with patient? Yes  SHORT TERM GOALS: Target date: 02/22/2023   Patient will be independent with initial HEP.  Baseline:  Goal status: ongoing  2.  Decreased mid and low back pain by 30% with ADLS Baseline:  Goal status: INITIAL   LONG TERM GOALS: Target date: 03/22/2023   Patient will be independent with advanced/ongoing HEP to improve outcomes and carryover.  Baseline:  Goal status: INITIAL  2.  Patient will report 75% improvement in low back pain with ADLs and sit to stand transfer to improve QOL.  Baseline:  Goal status: INITIAL  3.  Patient will demonstrate functional pain free ROM to perform  ADLs and to check blind spots while biking.   Baseline:  Goal status: INITIAL  4.  Patient will demonstrate improved core strength to 4+/5 to stabilize posture. Baseline:  Goal status: INITIAL  5.  Patient will report 38 on lumbar FOTO to demonstrate improved functional ability.  Baseline: 58 Goal status: INITIAL   6.  Patient to demonstrate ability to achieve and maintain good spinal alignment/posturing and body mechanics needed for daily activities. Baseline:  Goal status: INITIAL  8. Patient will demonstrate improved HS and hip flexor flexibility to allow for neutral spine in standing. Baseline:  Goal status: INITIAL   PLAN:  PT FREQUENCY: 2x/week  PT DURATION: 8 weeks  PLANNED INTERVENTIONS: Therapeutic exercises, Therapeutic activity, Neuromuscular re-education, Balance training, Patient/Family education, Self Care, Joint mobilization, Aquatic Therapy, Dry Needling, Electrical stimulation, Spinal mobilization, Cryotherapy, Moist heat, and Manual therapy.  PLAN FOR NEXT SESSION: consider adding DN to bil HS,Check STG, Focus on flexibility; Add thoracic mobility, hip flexor/quad and abdominal flexibility to HEP, Work on diaphragmatic breathing and rib mobility, core and postural strengthening.    Solon Palm, PT  02/18/23 5:17 PM

## 2023-02-18 ENCOUNTER — Ambulatory Visit: Payer: Medicare Other | Attending: Neurological Surgery | Admitting: Physical Therapy

## 2023-02-18 ENCOUNTER — Encounter: Payer: Self-pay | Admitting: Physical Therapy

## 2023-02-18 DIAGNOSIS — R293 Abnormal posture: Secondary | ICD-10-CM | POA: Diagnosis present

## 2023-02-18 DIAGNOSIS — M5459 Other low back pain: Secondary | ICD-10-CM | POA: Diagnosis present

## 2023-02-18 DIAGNOSIS — M546 Pain in thoracic spine: Secondary | ICD-10-CM | POA: Insufficient documentation

## 2023-02-21 NOTE — Therapy (Signed)
OUTPATIENT PHYSICAL THERAPY THORACOLUMBAR TREATMENT   Patient Name: Mason Irwin MRN: [id number] DOB:04-11-1956, 67 y.o., male Today's Date: 02/22/2023  END OF SESSION:  PT End of Session - 02/22/23 0801     Visit Number 4    Date for PT Re-Evaluation 03/22/23    Authorization Type MCR    Progress Note Due on Visit 10    PT Start Time 0801    PT Stop Time 0851    PT Time Calculation (min) 50 min    Activity Tolerance Patient tolerated treatment well    Behavior During Therapy WFL for tasks assessed/performed                Past Medical History:  Diagnosis Date   Allergy    Anxiety    Eczema    hands, chronic and recurrent   Family history of prostate cancer in father    FH: early coronary artery disease    Hemorrhoids    Hyperlipidemia    Hyperplastic colon polyp    Pure hypercholesterolemia    S/P appendectomy    S/P bilateral inguinal hernia repair    91 weeks old    Small intestinal bacterial overgrowth (SIBO)    Past Surgical History:  Procedure Laterality Date   APPENDECTOMY     age 11 weeks   COLONOSCOPY     INGUINAL HERNIA REPAIR Bilateral    with appendectomy, age 79 weeks   LUMBAR DISC SURGERY     L4-L5   Patient Active Problem List   Diagnosis Date Noted   Morton's neuroma, right 04/05/2021   Injury of right suprascapular nerve 03/02/2021   Small intestinal bacterial overgrowth (SIBO) 10/20/2020   Fracture of shaft of clavicle, closed 01/14/2020   Routine general medical examination at a health care facility 09/29/2013   Low back pain 10/04/2011   HSV infection 07/10/2011   DERMATITIS, CONTACT, NOS 03/17/2007    PCP: Farris Has, MD   REFERRING PROVIDER: Dawley, Alan Mulder, DO   REFERRING DIAG: M41.9 (ICD-10-CM) - Scoliosis, unspecified   Rationale for Evaluation and Treatment: Rehabilitation  THERAPY DIAG:  Other low back pain  Pain in thoracic spine  Abnormal posture  ONSET DATE: 2021  SUBJECTIVE:                                                                                                                                                                                            SUBJECTIVE STATEMENT: Patient reports he has low energy and has not done his stretching. He struggled the last 10 miles of his bike ride yesterday and was too tired to stretch.  Eval: Pain is in midsection of back and down left side wraps around toward front. Also has pain at L4/5 and keeps him from straightening up. Had a fall in 2021 and broke ribs and clavicle. Since then pain has worsened.  He has winging of R scapula (one MD thinks it was nerve damage from the fall). LBP - bending, scrubbing, standing up. Mid back is more with sitting. Has been working on squats and Lawyer. Breathing exercises, hanging. Cycles a lot so HS tight. Some lateral R calf numbness from laminectomy.  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 Yes: NPRS scale: 3 today up to 4in midback/lowback /10 Pain location: mid and low back, in b/w scapula and sometimes into neck Pain description: tight, lower back pinch, upper back like I've been whacked Aggravating factors: bending, standing up Relieving factors: meds, massage table, stretching legs  PRECAUTIONS: None  RED FLAGS: None   WEIGHT BEARING RESTRICTIONS: No  FALLS:  Has patient fallen in last 6 months? No  LIVING ENVIRONMENT: Lives with: lives with their spouse Lives in: House/apartment  OCCUPATION: retired, cyclist  PLOF: Independent  PATIENT GOALS: reduce the pain  NEXT MD VISIT: none scheduled  OBJECTIVE:   DIAGNOSTIC FINDINGS:  03/02/20 AP lateral of the thoracic spine shows normal kyphotic curvature without  any evidence of compression fracture.  There are multilevel degenerative  disc changes mild.  He does have significant cervical thoracic lumbar  scoliosis  particularly in the thoracolumbar region where he has a  rightward convex curvature with rotatory component.  He has compensated  curve with fairly straight thoracic spine but begins to curve again going  into the cervical area.   Cervical AP and lateral shows normal lordotic curvature without listhesis.   There is some facet degenerative change in the upper cervical as well as  medical cervical area but nothing really high-grade.  Some uncovertebral  joint hypertrophy at C5-6.  There is mild compensatory scoliosis with a  rightward lean.   PATIENT SURVEYS:  FOTO 58 (61 predicted)  COGNITION: Overall cognitive status: Within functional limits for tasks assessed     SENSATION: Reports some numbness in R lateral calf since laminectomy  MUSCLE LENGTH: Marked HS, pirformis R>L, ITB, quads, L HF  POSTURE: forward head, posterior pelvic tilt, and R thoracic convexity scoliosis, flexed hips/knees, decreased cervical lordosis  PALPATION: HS insertion B, some tightness in left diaphragm compared to R (likely from scoliosis), Decreased lumbar/thoracic spinal mobility but no pain reported.  LUMBAR ROM:   AROM eval  Flexion Hands to just below knees*  Extension 10 flexes knees*  Right lateral flexion 10*  Left lateral flexion 10*  Right rotation 75%  Left rotation 60%   (Blank rows = not tested)  LOWER EXTREMITY MMT:     MMT  Right eval Left eval  Hip flexion 4+ 4+  Hip extension 5 5  Hip abduction 5 5  Hip adduction 5 5  Hip internal rotation    Hip external rotation    Knee flexion 5 5  Knee extension 5 5  Ankle dorsiflexion 5 5   (Blank rows = not tested)  LOWER EXTREMITY ROM:  B Knee extension limited by tight HS, else [facility]    TODAY'S TREATMENT:  DATE:   02/22/23 Elliptical L2 Incline x 5 min Open Book x 5 B with prolonged hold on last  rep Quadriped thread the needle to thoracic rotation arm to ceiling x 5 ea - some pain in left QL Seated lumbar stretch over Blue physioball fwd and each side x 30 sec Sidelying R over soft foam roller for L QL stretch x 1 min Supine QL stretch in reverse "C" position - less tolerable to pt Manual: Skilled palpation and monitoring of soft tissues during DN. STM to L lumbar. TPR to L QL prior to DN. UPA mobs to L lumbar.  Trigger Point Dry-Needling  Treatment instructions: Expect mild to moderate muscle soreness. S/S of pneumothorax if dry needled over a lung field, and to seek immediate medical attention should they occur. Patient verbalized understanding of these instructions and education. Patient Consent Given: Yes Education handout provided: Yes Muscles treated: L QL and lumbar multifidi Electrical stimulation performed: No Parameters: N/A Treatment response/outcome: Twitch Response Elicited and Palpable Increase in Muscle Length   02/18/23 NuStep L7 x 4' PT present to discuss status Standing HS stretch foot on stp 3x30" bil Hip flexor stretch 2 x 60 sec on stairs. Foam rolling to LE Discussion of Yin Yoga for stretching post cycling Manual: Skilled palpation and monitoring of soft tissues during DN. STM to L gluteals and lateral quads Trigger Point Dry-Needling  Treatment instructions: Expect mild to moderate muscle soreness. S/S of pneumothorax if dry needled over a lung field, and to seek immediate medical attention should they occur. Patient verbalized understanding of these instructions and education. Patient Consent Given: Yes Education handout provided: Yes Muscles treated: L gluteals and L lumbar multifidi Electrical stimulation performed: No Parameters: N/A Treatment response/outcome: Twitch Response Elicited and Palpable Increase in Muscle Length    01/28/23: NuStep L7 x 4' PT present to discuss status Seated HS stretch 2x30" bil Quad stretch kneeling on foam, transition  to hip flexor stretch with OH reach x 30" each Trial of hip flexor stretch standing in doorframe with OH reach along doorframe x 30" each Laying on vertical foam roller - diaphragmatic and ribcage expansion breathing, add Y/W/T arms with inhale exhale x 3 rounds Horiz abduction and diagonals UE green tband laying on vertical foam roller 5-10 each SL open book 3 rounds with 3 deep ribcage expansion breaths at end range, then 3 rounds just reps Option to do HS stretch with foot on bottom step and holding rails to allow for more gentle stretch to avoid pain guarding Prone manual therapy: lumbar and thoracic PA, rib springing bil  01/25/23 See pt ed and HEP    PATIENT EDUCATION:  Education details: HEP update  Person educated: Patient Education method: Explanation, Demonstration, and Handouts Education comprehension: verbalized understanding and returned demonstration  HOME EXERCISE PROGRAM: Access Code: [email or web address] URL: [email or web address] Date: 02/22/2023 Prepared by: Raynelle Fanning  Exercises - Supine ITB Stretch with Strap  - 2 x daily - 7 x weekly - 1 sets - 3 reps - 60 sec hold - Supine Quadriceps Stretch with Strap on Table  - 2 x daily - 7 x weekly - 1 sets - 3 reps - 30-60 sec hold - Seated Hamstring Stretch  - 2 x daily - 7 x weekly - 1 sets - 3 reps - 30-60 sec hold - Sidelying Thoracic Rotation with Open Book  - 1 x daily - 7 x weekly - 2 sets - 5 reps - Sidelying Quadratus Lumborum Stretch on Table  -  1 x daily - 3-4 x weekly - 1 sets - 3 reps - 30-60 hold - Sidelying ITB Stretch off Table (Mirrored)  - 1 x daily - 7 x weekly - 1 sets - 3 reps - 1-5 min hold - QL Stretch Supine (Mirrored)  - 1 x daily - 7 x weekly - 1 sets - 3 reps - 30-60 hold  Patient Education - Trigger Point Dry Needling  ASSESSMENT:  CLINICAL IMPRESSION: Tyon reports he had relief with DN after last visit and increased mobility. Today he has increased pain from yard work and also states he  is having less overall energy with biking and in general and feels he may have a digestive issue. For this reason, he states he has not done his stretching or tried yin yoga. We focused on spinal mobility today with positive results. His left QL was extremely tight and responded well to MT/DN. He demonstrated decreased pain and increased mobility with open books post manual compared to pre-manual. PT encouraged pt to continue stretching to maintain increases in mobility. He also reported less pain in his low back at the end of the session.   OBJECTIVE IMPAIRMENTS: decreased activity tolerance, decreased ROM, decreased strength, hypomobility, increased muscle spasms, impaired flexibility, postural dysfunction, and pain.   ACTIVITY LIMITATIONS: bending, sitting, and squatting  PARTICIPATION LIMITATIONS: cleaning  PERSONAL FACTORS: Age and Time since onset of injury/illness/exacerbation are also affecting patient's functional outcome.   REHAB POTENTIAL: Good  CLINICAL DECISION MAKING: Evolving/moderate complexity  EVALUATION COMPLEXITY: Moderate   GOALS: Goals reviewed with patient? Yes  SHORT TERM GOALS: Target date: 02/22/2023   Patient will be independent with initial HEP.  Baseline:  Goal status: IN PROGRESS  2.  Decreased mid and low back pain by 30% with ADLS Baseline:  Goal status: IN PROGRESS   LONG TERM GOALS: Target date: 03/22/2023   Patient will be independent with advanced/ongoing HEP to improve outcomes and carryover.  Baseline:  Goal status: INITIAL  2.  Patient will report 75% improvement in low back pain with ADLs and sit to stand transfer to improve QOL.  Baseline:  Goal status: INITIAL  3.  Patient will demonstrate functional pain free ROM to perform ADLs and to check blind spots while biking.   Baseline:  Goal status: INITIAL  4.  Patient will demonstrate improved core strength to 4+/5 to stabilize posture. Baseline:  Goal status: INITIAL  5.  Patient  will report 60 on lumbar FOTO to demonstrate improved functional ability.  Baseline: 58 Goal status: INITIAL   6.  Patient to demonstrate ability to achieve and maintain good spinal alignment/posturing and body mechanics needed for daily activities. Baseline:  Goal status: INITIAL  8. Patient will demonstrate improved HS and hip flexor flexibility to allow for neutral spine in standing. Baseline:  Goal status: INITIAL   PLAN:  PT FREQUENCY: 2x/week  PT DURATION: 8 weeks  PLANNED INTERVENTIONS: Therapeutic exercises, Therapeutic activity, Neuromuscular re-education, Balance training, Patient/Family education, Self Care, Joint mobilization, Aquatic Therapy, Dry Needling, Electrical stimulation, Spinal mobilization, Cryotherapy, Moist heat, and Manual therapy.  PLAN FOR NEXT SESSION: consider adding DN to bil HS,Check STG, Focus on flexibility; Add thoracic mobility, hip flexor/quad and abdominal flexibility to HEP, Work on diaphragmatic breathing and rib mobility, core and postural strengthening.    Solon Palm, PT  02/22/23 9:14 AM

## 2023-02-22 ENCOUNTER — Ambulatory Visit: Payer: Medicare Other | Admitting: Physical Therapy

## 2023-02-22 ENCOUNTER — Encounter: Payer: Self-pay | Admitting: Physical Therapy

## 2023-02-22 DIAGNOSIS — M5459 Other low back pain: Secondary | ICD-10-CM | POA: Diagnosis not present

## 2023-02-22 DIAGNOSIS — M546 Pain in thoracic spine: Secondary | ICD-10-CM

## 2023-02-22 DIAGNOSIS — R293 Abnormal posture: Secondary | ICD-10-CM

## 2023-02-26 ENCOUNTER — Ambulatory Visit: Payer: Medicare Other | Admitting: Physical Therapy

## 2023-02-26 ENCOUNTER — Encounter: Payer: Self-pay | Admitting: Physical Therapy

## 2023-02-26 DIAGNOSIS — M5459 Other low back pain: Secondary | ICD-10-CM | POA: Diagnosis not present

## 2023-02-26 DIAGNOSIS — M546 Pain in thoracic spine: Secondary | ICD-10-CM

## 2023-02-26 DIAGNOSIS — R293 Abnormal posture: Secondary | ICD-10-CM

## 2023-02-26 NOTE — Therapy (Signed)
OUTPATIENT PHYSICAL THERAPY THORACOLUMBAR TREATMENT   Patient Name: Mason Irwin MRN: [id number] DOB:January 28, 1956, 67 y.o., male Today's Date: 02/26/2023  END OF SESSION:  PT End of Session - 02/26/23 1531     Visit Number 5    Date for PT Re-Evaluation 03/22/23    Authorization Type MCR    Progress Note Due on Visit 10    PT Start Time 1531    PT Stop Time 1615    PT Time Calculation (min) 44 min    Activity Tolerance Patient tolerated treatment well    Behavior During Therapy WFL for tasks assessed/performed                 Past Medical History:  Diagnosis Date   Allergy    Anxiety    Eczema    hands, chronic and recurrent   Family history of prostate cancer in father    FH: early coronary artery disease    Hemorrhoids    Hyperlipidemia    Hyperplastic colon polyp    Pure hypercholesterolemia    S/P appendectomy    S/P bilateral inguinal hernia repair    42 weeks old    Small intestinal bacterial overgrowth (SIBO)    Past Surgical History:  Procedure Laterality Date   APPENDECTOMY     age 79 weeks   COLONOSCOPY     INGUINAL HERNIA REPAIR Bilateral    with appendectomy, age 797 weeks   LUMBAR DISC SURGERY     L4-L5   Patient Active Problem List   Diagnosis Date Noted   Morton's neuroma, right 04/05/2021   Injury of right suprascapular nerve 03/02/2021   Small intestinal bacterial overgrowth (SIBO) 10/20/2020   Fracture of shaft of clavicle, closed 01/14/2020   Routine general medical examination at a health care facility 09/29/2013   Low back pain 10/04/2011   HSV infection 07/10/2011   DERMATITIS, CONTACT, NOS 03/17/2007    PCP: Farris Has, MD   REFERRING PROVIDER: Dawley, Alan Mulder, DO   REFERRING DIAG: M41.9 (ICD-10-CM) - Scoliosis, unspecified   Rationale for Evaluation and Treatment: Rehabilitation  THERAPY DIAG:  Other low back pain  Pain in thoracic spine  Abnormal posture  ONSET DATE: 2021  SUBJECTIVE:                                                                                                                                                                                            SUBJECTIVE STATEMENT: Patient reports he has not done any stretching.   Eval: Pain is in midsection of back and down left side wraps around toward front. Also has pain at  L4/5 and keeps him from straightening up. Had a fall in 2021 and broke ribs and clavicle. Since then pain has worsened.  He has winging of R scapula (one MD thinks it was nerve damage from the fall). LBP - bending, scrubbing, standing up. Mid back is more with sitting. Has been working on squats and Lawyer. Breathing exercises, hanging. Cycles a lot so HS tight. Some lateral R calf numbness from laminectomy.  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 Yes: NPRS scale: 3 today up to 4 in midback/lowback /10 Pain location: mid and low back, in b/w scapula and sometimes into neck Pain description: tight, lower back pinch, upper back like I've been whacked Aggravating factors: bending, standing up Relieving factors: meds, massage table, stretching legs  PRECAUTIONS: None  RED FLAGS: None   WEIGHT BEARING RESTRICTIONS: No  FALLS:  Has patient fallen in last 6 months? No  LIVING ENVIRONMENT: Lives with: lives with their spouse Lives in: House/apartment  OCCUPATION: retired, cyclist  PLOF: Independent  PATIENT GOALS: reduce the pain  NEXT MD VISIT: none scheduled  OBJECTIVE:   DIAGNOSTIC FINDINGS:  03/02/20 AP lateral of the thoracic spine shows normal kyphotic curvature without  any evidence of compression fracture.  There are multilevel degenerative  disc changes mild.  He does have significant cervical thoracic lumbar  scoliosis particularly in the thoracolumbar region where he has a  rightward convex curvature with rotatory component.  He  has compensated  curve with fairly straight thoracic spine but begins to curve again going  into the cervical area.   Cervical AP and lateral shows normal lordotic curvature without listhesis.   There is some facet degenerative change in the upper cervical as well as  medical cervical area but nothing really high-grade.  Some uncovertebral  joint hypertrophy at C5-6.  There is mild compensatory scoliosis with a  rightward lean.   PATIENT SURVEYS:  FOTO 58 (61 predicted)  COGNITION: Overall cognitive status: Within functional limits for tasks assessed     SENSATION: Reports some numbness in R lateral calf since laminectomy  MUSCLE LENGTH: Marked HS, pirformis R>L, ITB, quads, L HF  POSTURE: forward head, posterior pelvic tilt, and R thoracic convexity scoliosis, flexed hips/knees, decreased cervical lordosis  PALPATION: HS insertion B, some tightness in left diaphragm compared to R (likely from scoliosis), Decreased lumbar/thoracic spinal mobility but no pain reported.  LUMBAR ROM:   AROM eval  Flexion Hands to just below knees*  Extension 10 flexes knees*  Right lateral flexion 10*  Left lateral flexion 10*  Right rotation 75%  Left rotation 60%   (Blank rows = not tested)  LOWER EXTREMITY MMT:     MMT  Right eval Left eval  Hip flexion 4+ 4+  Hip extension 5 5  Hip abduction 5 5  Hip adduction 5 5  Hip internal rotation    Hip external rotation    Knee flexion 5 5  Knee extension 5 5  Ankle dorsiflexion 5 5   (Blank rows = not tested)  LOWER EXTREMITY ROM:  B Knee extension limited by tight HS, else [facility]    TODAY'S TREATMENT:  DATE:   02/26/23 Elliptical L2 Incline x 5 min Standing L-T spine extension at raised mat table 2x 1 min with deep breaths Kneeling toes in extension hands on heels 2x max hold for trunk extension and quad  stretch Open Book x 5 breaths with prolonged hold on last rep Seated deep breathing with rib compression Diaphragm release in supine Serratus push R 7#  x 10 Serratus push up on mat table x 10 incline  Manual: Skilled palpation and monitoring of soft tissues during DN. STM to L lower thoracic and lumbar paraspinals.  Trigger Point Dry-Needling  Treatment instructions: Expect mild to moderate muscle soreness. S/S of pneumothorax if dry needled over a lung field, and to seek immediate medical attention should they occur. Patient verbalized understanding of these instructions and education. Patient Consent Given: Yes Education handout provided: Yes Muscles treated: L lower throacic and lumbar multifidi Electrical stimulation performed: No Parameters: N/A Treatment response/outcome: Twitch Response Elicited and Palpable Increase in Muscle Length   02/22/23 Elliptical L2 Incline x 5 min Open Book x 5 B with prolonged hold on last rep Quadriped thread the needle to thoracic rotation arm to ceiling x 5 ea - some pain in left QL Seated lumbar stretch over Blue physioball fwd and each side x 30 sec Sidelying R over soft foam roller for L QL stretch x 1 min Supine QL stretch in reverse "C" position - less tolerable to pt Manual: Skilled palpation and monitoring of soft tissues during DN. STM to L lumbar. TPR to L QL prior to DN. UPA mobs to L lumbar.  Trigger Point Dry-Needling  Treatment instructions: Expect mild to moderate muscle soreness. S/S of pneumothorax if dry needled over a lung field, and to seek immediate medical attention should they occur. Patient verbalized understanding of these instructions and education. Patient Consent Given: Yes Education handout provided: Yes Muscles treated: L QL and lumbar multifidi Electrical stimulation performed: No Parameters: N/A Treatment response/outcome: Twitch Response Elicited and Palpable Increase in Muscle Length   02/18/23 NuStep L7 x 4' PT  present to discuss status Standing HS stretch foot on stp 3x30" bil Hip flexor stretch 2 x 60 sec on stairs. Foam rolling to LE Discussion of Yin Yoga for stretching post cycling Manual: Skilled palpation and monitoring of soft tissues during DN. STM to L gluteals and lateral quads Trigger Point Dry-Needling  Treatment instructions: Expect mild to moderate muscle soreness. S/S of pneumothorax if dry needled over a lung field, and to seek immediate medical attention should they occur. Patient verbalized understanding of these instructions and education. Patient Consent Given: Yes Education handout provided: Yes Muscles treated: L gluteals and L lumbar multifidi Electrical stimulation performed: No Parameters: N/A Treatment response/outcome: Twitch Response Elicited and Palpable Increase in Muscle Length    01/28/23: NuStep L7 x 4' PT present to discuss status Seated HS stretch 2x30" bil Quad stretch kneeling on foam, transition to hip flexor stretch with OH reach x 30" each Trial of hip flexor stretch standing in doorframe with OH reach along doorframe x 30" each Laying on vertical foam roller - diaphragmatic and ribcage expansion breathing, add Y/W/T arms with inhale exhale x 3 rounds Horiz abduction and diagonals UE green tband laying on vertical foam roller 5-10 each SL open book 3 rounds with 3 deep ribcage expansion breaths at end range, then 3 rounds just reps Option to do HS stretch with foot on bottom step and holding rails to allow for more gentle stretch to avoid  pain guarding Prone manual therapy: lumbar and thoracic PA, rib springing bil  01/25/23 See pt ed and HEP    PATIENT EDUCATION:  Education details: HEP update  Person educated: Patient Education method: Explanation, Demonstration, and Handouts Education comprehension: verbalized understanding and returned demonstration  HOME EXERCISE PROGRAM: Access Code: [email or web address] URL: [email or web address] Date:  02/22/2023 Prepared by: Raynelle Fanning  Exercises - Supine ITB Stretch with Strap  - 2 x daily - 7 x weekly - 1 sets - 3 reps - 60 sec hold - Supine Quadriceps Stretch with Strap on Table  - 2 x daily - 7 x weekly - 1 sets - 3 reps - 30-60 sec hold - Seated Hamstring Stretch  - 2 x daily - 7 x weekly - 1 sets - 3 reps - 30-60 sec hold - Sidelying Thoracic Rotation with Open Book  - 1 x daily - 7 x weekly - 2 sets - 5 reps - Sidelying Quadratus Lumborum Stretch on Table  - 1 x daily - 3-4 x weekly - 1 sets - 3 reps - 30-60 hold - Sidelying ITB Stretch off Table (Mirrored)  - 1 x daily - 7 x weekly - 1 sets - 3 reps - 1-5 min hold - QL Stretch Supine (Mirrored)  - 1 x daily - 7 x weekly - 1 sets - 3 reps - 30-60 hold  Patient Education - Trigger Point Dry Needling  ASSESSMENT:  CLINICAL IMPRESSION: [facility] continues to be non-compliant with HEP. We continued thoracic mobility exercises today and ended with DN along lower thoracic spine with very good response and increased mobility and decreased pain at end of session. Began some serratus strengthening today as well and addressed diaphragm tightness.    OBJECTIVE IMPAIRMENTS: decreased activity tolerance, decreased ROM, decreased strength, hypomobility, increased muscle spasms, impaired flexibility, postural dysfunction, and pain.   ACTIVITY LIMITATIONS: bending, sitting, and squatting  PARTICIPATION LIMITATIONS: cleaning  PERSONAL FACTORS: Age and Time since onset of injury/illness/exacerbation are also affecting patient's functional outcome.   REHAB POTENTIAL: Good  CLINICAL DECISION MAKING: Evolving/moderate complexity  EVALUATION COMPLEXITY: Moderate   GOALS: Goals reviewed with patient? Yes  SHORT TERM GOALS: Target date: 02/22/2023   Patient will be independent with initial HEP.  Baseline:  Goal status: IN PROGRESS  2.  Decreased mid and low back pain by 30% with ADLS Baseline:  Goal status: IN PROGRESS   LONG TERM GOALS:  Target date: 03/22/2023   Patient will be independent with advanced/ongoing HEP to improve outcomes and carryover.  Baseline:  Goal status: INITIAL  2.  Patient will report 75% improvement in low back pain with ADLs and sit to stand transfer to improve QOL.  Baseline:  Goal status: INITIAL  3.  Patient will demonstrate functional pain free ROM to perform ADLs and to check blind spots while biking.   Baseline:  Goal status: INITIAL  4.  Patient will demonstrate improved core strength to 4+/5 to stabilize posture. Baseline:  Goal status: INITIAL  5.  Patient will report 32 on lumbar FOTO to demonstrate improved functional ability.  Baseline: 58 Goal status: INITIAL   6.  Patient to demonstrate ability to achieve and maintain good spinal alignment/posturing and body mechanics needed for daily activities. Baseline:  Goal status: INITIAL  8. Patient will demonstrate improved HS and hip flexor flexibility to allow for neutral spine in standing. Baseline:  Goal status: INITIAL   PLAN:  PT FREQUENCY: 2x/week  PT DURATION: 8 weeks  PLANNED INTERVENTIONS: Therapeutic exercises, Therapeutic  activity, Neuromuscular re-education, Balance training, Patient/Family education, Self Care, Joint mobilization, Aquatic Therapy, Dry Needling, Electrical stimulation, Spinal mobilization, Cryotherapy, Moist heat, and Manual therapy.  PLAN FOR NEXT SESSION: consider adding DN to bil HS,Check STG, Focus on flexibility; Add thoracic mobility, hip flexor/quad and abdominal flexibility to HEP, Work on diaphragmatic breathing and rib mobility, core and postural strengthening.    Solon Palm, PT  02/26/23 4:15 PM

## 2023-02-27 ENCOUNTER — Other Ambulatory Visit: Payer: Self-pay | Admitting: Sports Medicine

## 2023-02-28 NOTE — Therapy (Signed)
OUTPATIENT PHYSICAL THERAPY THORACOLUMBAR TREATMENT   Patient Name: Mason Irwin MRN: [id number] DOB:1956/05/13, 67 y.o., male Today's Date: 03/01/2023  END OF SESSION:  PT End of Session - 03/01/23 0849     Visit Number 6    Date for PT Re-Evaluation 03/22/23    Authorization Type MCR    Progress Note Due on Visit 10    PT Start Time 0849    PT Stop Time 0932    PT Time Calculation (min) 43 min    Activity Tolerance Patient tolerated treatment well    Behavior During Therapy WFL for tasks assessed/performed                  Past Medical History:  Diagnosis Date   Allergy    Anxiety    Eczema    hands, chronic and recurrent   Family history of prostate cancer in father    FH: early coronary artery disease    Hemorrhoids    Hyperlipidemia    Hyperplastic colon polyp    Pure hypercholesterolemia    S/P appendectomy    S/P bilateral inguinal hernia repair    73 weeks old    Small intestinal bacterial overgrowth (SIBO)    Past Surgical History:  Procedure Laterality Date   APPENDECTOMY     age 41 weeks   COLONOSCOPY     INGUINAL HERNIA REPAIR Bilateral    with appendectomy, age 57 weeks   LUMBAR DISC SURGERY     L4-L5   Patient Active Problem List   Diagnosis Date Noted   Morton's neuroma, right 04/05/2021   Injury of right suprascapular nerve 03/02/2021   Small intestinal bacterial overgrowth (SIBO) 10/20/2020   Fracture of shaft of clavicle, closed 01/14/2020   Routine general medical examination at a health care facility 09/29/2013   Low back pain 10/04/2011   HSV infection 07/10/2011   DERMATITIS, CONTACT, NOS 03/17/2007    PCP: Farris Has, MD   REFERRING PROVIDER: Dawley, Alan Mulder, DO   REFERRING DIAG: M41.9 (ICD-10-CM) - Scoliosis, unspecified   Rationale for Evaluation and Treatment: Rehabilitation  THERAPY DIAG:  Other low back pain  Pain in thoracic spine  Abnormal posture  ONSET DATE: 2021  SUBJECTIVE:                                                                                                                                                                                            SUBJECTIVE STATEMENT: Back sore this morning, but no pain now.  Eval: Pain is in midsection of back and down left side wraps around toward front. Also has pain at  L4/5 and keeps him from straightening up. Had a fall in 2021 and broke ribs and clavicle. Since then pain has worsened.  He has winging of R scapula (one MD thinks it was nerve damage from the fall). LBP - bending, scrubbing, standing up. Mid back is more with sitting. Has been working on squats and Lawyer. Breathing exercises, hanging. Cycles a lot so HS tight. Some lateral R calf numbness from laminectomy.  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 Yes: NPRS scale: 0 today up to 4 in midback/lowback /10 Pain location: mid and low back, in b/w scapula and sometimes into neck Pain description: tight, lower back pinch, upper back like I've been whacked Aggravating factors: bending, standing up Relieving factors: meds, massage table, stretching legs  PRECAUTIONS: None  RED FLAGS: None   WEIGHT BEARING RESTRICTIONS: No  FALLS:  Has patient fallen in last 6 months? No  LIVING ENVIRONMENT: Lives with: lives with their spouse Lives in: House/apartment  OCCUPATION: retired, cyclist  PLOF: Independent  PATIENT GOALS: reduce the pain  NEXT MD VISIT: none scheduled  OBJECTIVE:   DIAGNOSTIC FINDINGS:  03/02/20 AP lateral of the thoracic spine shows normal kyphotic curvature without  any evidence of compression fracture.  There are multilevel degenerative  disc changes mild.  He does have significant cervical thoracic lumbar  scoliosis particularly in the thoracolumbar region where he has a  rightward convex curvature with rotatory component.  He has  compensated  curve with fairly straight thoracic spine but begins to curve again going  into the cervical area.   Cervical AP and lateral shows normal lordotic curvature without listhesis.   There is some facet degenerative change in the upper cervical as well as  medical cervical area but nothing really high-grade.  Some uncovertebral  joint hypertrophy at C5-6.  There is mild compensatory scoliosis with a  rightward lean.   PATIENT SURVEYS:  FOTO 58 (61 predicted)  COGNITION: Overall cognitive status: Within functional limits for tasks assessed     SENSATION: Reports some numbness in R lateral calf since laminectomy  MUSCLE LENGTH: Marked HS, pirformis R>L, ITB, quads, L HF  POSTURE: forward head, posterior pelvic tilt, and R thoracic convexity scoliosis, flexed hips/knees, decreased cervical lordosis  PALPATION: HS insertion B, some tightness in left diaphragm compared to R (likely from scoliosis), Decreased lumbar/thoracic spinal mobility but no pain reported.  LUMBAR ROM:   AROM eval  Flexion Hands to just below knees*  Extension 10 flexes knees*  Right lateral flexion 10*  Left lateral flexion 10*  Right rotation 75%  Left rotation 60%   (Blank rows = not tested)  LOWER EXTREMITY MMT:     MMT  Right eval Left eval  Hip flexion 4+ 4+  Hip extension 5 5  Hip abduction 5 5  Hip adduction 5 5  Hip internal rotation    Hip external rotation    Knee flexion 5 5  Knee extension 5 5  Ankle dorsiflexion 5 5   (Blank rows = not tested)  LOWER EXTREMITY ROM:  B Knee extension limited by tight HS, else [facility]    TODAY'S TREATMENT:  DATE:   03/01/23 Elliptical L2 Incline x 5 min B HS stretch 2x30 B Gastroc stretch at wall Supine serratus push B with blue band x 20 (did some in standing but caused low back tightness Kneeling toes in extension  hands on heels 2x 30 sec hold for trunk extension and quad stretch Serratus push up on mat table modified plank position x 10 incline Manual: Skilled palpation and monitoring of soft tissues during DN. Trigger Point Dry-Needling  Treatment instructions: Expect mild to moderate muscle soreness. S/S of pneumothorax if dry needled over a lung field, and to seek immediate medical attention should they occur. Patient verbalized understanding of these instructions and education. Patient Consent Given: Yes Education handout provided: Yes Muscles treated: B lumbar multifidi (no R L4); R gluteals Electrical stimulation performed: No Parameters: N/A Treatment response/outcome: Twitch Response Elicited and Palpable Increase in Muscle Length   02/26/23 Elliptical L2 Incline x 5 min Standing L-T spine extension at raised mat table 2x 1 min with deep breaths Kneeling toes in extension hands on heels 2x max hold for trunk extension and quad stretch Open Book x 5 breaths with prolonged hold on last rep Seated deep breathing with rib compression Diaphragm release in supine Serratus push R 7#  x 10 Serratus push up on mat table x 10 incline  Manual: Skilled palpation and monitoring of soft tissues during DN.  Trigger Point Dry-Needling  Treatment instructions: Expect mild to moderate muscle soreness. S/S of pneumothorax if dry needled over a lung field, and to seek immediate medical attention should they occur. Patient verbalized understanding of these instructions and education. Patient Consent Given: Yes Education handout provided: Yes Muscles treated: L lower throacic and lumbar multifidi Electrical stimulation performed: No Parameters: N/A Treatment response/outcome: Twitch Response Elicited and Palpable Increase in Muscle Length   02/22/23 Elliptical L2 Incline x 5 min Open Book x 5 B with prolonged hold on last rep Quadriped thread the needle to thoracic rotation arm to ceiling x 5 ea - some pain  in left QL Seated lumbar stretch over Blue physioball fwd and each side x 30 sec Sidelying R over soft foam roller for L QL stretch x 1 min Supine QL stretch in reverse "C" position - less tolerable to pt Manual: Skilled palpation and monitoring of soft tissues during DN. STM to L lumbar. TPR to L QL prior to DN. UPA mobs to L lumbar.  Trigger Point Dry-Needling  Treatment instructions: Expect mild to moderate muscle soreness. S/S of pneumothorax if dry needled over a lung field, and to seek immediate medical attention should they occur. Patient verbalized understanding of these instructions and education. Patient Consent Given: Yes Education handout provided: Yes Muscles treated: L QL and lumbar multifidi Electrical stimulation performed: No Parameters: N/A Treatment response/outcome: Twitch Response Elicited and Palpable Increase in Muscle Length   02/18/23 NuStep L7 x 4' PT present to discuss status Standing HS stretch foot on stp 3x30" bil Hip flexor stretch 2 x 60 sec on stairs. Foam rolling to LE Discussion of Yin Yoga for stretching post cycling Manual: Skilled palpation and monitoring of soft tissues during DN. STM to L gluteals and lateral quads Trigger Point Dry-Needling  Treatment instructions: Expect mild to moderate muscle soreness. S/S of pneumothorax if dry needled over a lung field, and to seek immediate medical attention should they occur. Patient verbalized understanding of these instructions and education. Patient Consent Given: Yes Education handout provided: Yes Muscles treated: L gluteals and L lumbar multifidi  Electrical stimulation performed: No Parameters: N/A Treatment response/outcome: Twitch Response Elicited and Palpable Increase in Muscle Length    01/28/23: NuStep L7 x 4' PT present to discuss status Seated HS stretch 2x30" bil Quad stretch kneeling on foam, transition to hip flexor stretch with OH reach x 30" each Trial of hip flexor stretch standing in  doorframe with OH reach along doorframe x 30" each Laying on vertical foam roller - diaphragmatic and ribcage expansion breathing, add Y/W/T arms with inhale exhale x 3 rounds Horiz abduction and diagonals UE green tband laying on vertical foam roller 5-10 each SL open book 3 rounds with 3 deep ribcage expansion breaths at end range, then 3 rounds just reps Option to do HS stretch with foot on bottom step and holding rails to allow for more gentle stretch to avoid pain guarding Prone manual therapy: lumbar and thoracic PA, rib springing bil  01/25/23 See pt ed and HEP    PATIENT EDUCATION:  Education details: HEP update  Person educated: Patient Education method: Explanation, Demonstration, and Handouts Education comprehension: verbalized understanding and returned demonstration  HOME EXERCISE PROGRAM: Access Code: [email or web address] URL: [email or web address] Date: 02/22/2023 Prepared by: Raynelle Fanning  Exercises - Supine ITB Stretch with Strap  - 2 x daily - 7 x weekly - 1 sets - 3 reps - 60 sec hold - Supine Quadriceps Stretch with Strap on Table  - 2 x daily - 7 x weekly - 1 sets - 3 reps - 30-60 sec hold - Seated Hamstring Stretch  - 2 x daily - 7 x weekly - 1 sets - 3 reps - 30-60 sec hold - Sidelying Thoracic Rotation with Open Book  - 1 x daily - 7 x weekly - 2 sets - 5 reps - Sidelying Quadratus Lumborum Stretch on Table  - 1 x daily - 3-4 x weekly - 1 sets - 3 reps - 30-60 hold - Sidelying ITB Stretch off Table (Mirrored)  - 1 x daily - 7 x weekly - 1 sets - 3 reps - 1-5 min hold - QL Stretch Supine (Mirrored)  - 1 x daily - 7 x weekly - 1 sets - 3 reps - 30-60 hold  Patient Education - Trigger Point Dry Needling  ASSESSMENT:  CLINICAL IMPRESSION: Karriem continues to be non-compliant with HEP. He reports 10-20% improvement in symptoms, but would likely be having better results if he would do his HEP. He states he will try to commit to do at least some of his HEP with his  wife to hold him accountable. He is active daily riding his bike and working in his yard, but states he is having difficulty being motivated to stretch or do anything after working. He just wants to lie down because his back hurts. He reports ongoing overall tightness and demonstrates marked, visible tightness of his legs. His back muscles are getting looser. His R hip was very tight again today. Good response to DN to this area. Cashius continues to demonstrate potential for improvement and would benefit from continued skilled therapy to address impairments.    OBJECTIVE IMPAIRMENTS: decreased activity tolerance, decreased ROM, decreased strength, hypomobility, increased muscle spasms, impaired flexibility, postural dysfunction, and pain.   ACTIVITY LIMITATIONS: bending, sitting, and squatting  PARTICIPATION LIMITATIONS: cleaning  PERSONAL FACTORS: Age and Time since onset of injury/illness/exacerbation are also affecting patient's functional outcome.   REHAB POTENTIAL: Good  CLINICAL DECISION MAKING: Evolving/moderate complexity  EVALUATION COMPLEXITY: Moderate   GOALS: Goals reviewed with patient? Yes  SHORT  TERM GOALS: Target date: 02/22/2023   Patient will be independent with initial HEP.  Baseline:  Goal status: IN PROGRESS - non compliant  2.  Decreased mid and low back pain by 30% with ADLS Baseline:  Goal status: IN PROGRESS 10-20%    LONG TERM GOALS: Target date: 03/22/2023   Patient will be independent with advanced/ongoing HEP to improve outcomes and carryover.  Baseline:  Goal status: INITIAL  2.  Patient will report 75% improvement in low back pain with ADLs and sit to stand transfer to improve QOL.  Baseline:  Goal status: INITIAL  3.  Patient will demonstrate functional pain free ROM to perform ADLs and to check blind spots while biking.   Baseline:  Goal status: INITIAL  4.  Patient will demonstrate improved core strength to 4+/5 to stabilize  posture. Baseline:  Goal status: INITIAL  5.  Patient will report 12 on lumbar FOTO to demonstrate improved functional ability.  Baseline: 58 Goal status: INITIAL   6.  Patient to demonstrate ability to achieve and maintain good spinal alignment/posturing and body mechanics needed for daily activities. Baseline:  Goal status: INITIAL  8. Patient will demonstrate improved HS and hip flexor flexibility to allow for neutral spine in standing. Baseline:  Goal status: INITIAL   PLAN:  PT FREQUENCY: 2x/week  PT DURATION: 8 weeks  PLANNED INTERVENTIONS: Therapeutic exercises, Therapeutic activity, Neuromuscular re-education, Balance training, Patient/Family education, Self Care, Joint mobilization, Aquatic Therapy, Dry Needling, Electrical stimulation, Spinal mobilization, Cryotherapy, Moist heat, and Manual therapy.  PLAN FOR NEXT SESSION: Focus on flexibility; Add thoracic mobility, hip flexor/quad and abdominal flexibility to HEP, Work on diaphragmatic breathing and rib mobility, core and postural strengthening.    Solon Palm, PT  03/01/23 11:29 AM

## 2023-03-01 ENCOUNTER — Encounter: Payer: Self-pay | Admitting: Physical Therapy

## 2023-03-01 ENCOUNTER — Ambulatory Visit: Payer: Medicare Other | Admitting: Physical Therapy

## 2023-03-01 DIAGNOSIS — R293 Abnormal posture: Secondary | ICD-10-CM

## 2023-03-01 DIAGNOSIS — M5459 Other low back pain: Secondary | ICD-10-CM | POA: Diagnosis not present

## 2023-03-01 DIAGNOSIS — M546 Pain in thoracic spine: Secondary | ICD-10-CM

## 2023-03-04 NOTE — Therapy (Signed)
OUTPATIENT PHYSICAL THERAPY THORACOLUMBAR TREATMENT   Patient Name: Mason Irwin MRN: [id number] DOB:1956/03/11, 67 y.o., male Today's Date: 03/05/2023  END OF SESSION:  PT End of Session - 03/05/23 1531     Visit Number 7    Date for PT Re-Evaluation 03/22/23    Authorization Type MCR    Progress Note Due on Visit 10    PT Start Time 1533    PT Stop Time 1620    PT Time Calculation (min) 47 min    Activity Tolerance Patient tolerated treatment well    Behavior During Therapy WFL for tasks assessed/performed                   Past Medical History:  Diagnosis Date   Allergy    Anxiety    Eczema    hands, chronic and recurrent   Family history of prostate cancer in father    FH: early coronary artery disease    Hemorrhoids    Hyperlipidemia    Hyperplastic colon polyp    Pure hypercholesterolemia    S/P appendectomy    S/P bilateral inguinal hernia repair    19 weeks old    Small intestinal bacterial overgrowth (SIBO)    Past Surgical History:  Procedure Laterality Date   APPENDECTOMY     age 28 weeks   COLONOSCOPY     INGUINAL HERNIA REPAIR Bilateral    with appendectomy, age 57 weeks   LUMBAR DISC SURGERY     L4-L5   Patient Active Problem List   Diagnosis Date Noted   Morton's neuroma, right 04/05/2021   Injury of right suprascapular nerve 03/02/2021   Small intestinal bacterial overgrowth (SIBO) 10/20/2020   Fracture of shaft of clavicle, closed 01/14/2020   Routine general medical examination at a health care facility 09/29/2013   Low back pain 10/04/2011   HSV infection 07/10/2011   DERMATITIS, CONTACT, NOS 03/17/2007    PCP: Farris Has, MD   REFERRING PROVIDER: Dawley, Alan Mulder, DO   REFERRING DIAG: M41.9 (ICD-10-CM) - Scoliosis, unspecified   Rationale for Evaluation and Treatment: Rehabilitation  THERAPY DIAG:  Other low back pain  Pain in thoracic spine  Abnormal posture  ONSET DATE: 2021  SUBJECTIVE:                                                                                                                                                                                            SUBJECTIVE STATEMENT: Patient reporting increased burning and tingling in forearms and hands today. Did stretching this morning which helps. Also started getting sx every other day x 1 week  down R LE in lateral quad and lateral lower leg.   Eval: Pain is in midsection of back and down left side wraps around toward front. Also has pain at L4/5 and keeps him from straightening up. Had a fall in 2021 and broke ribs and clavicle. Since then pain has worsened.  He has winging of R scapula (one MD thinks it was nerve damage from the fall). LBP - bending, scrubbing, standing up. Mid back is more with sitting. Has been working on squats and Lawyer. Breathing exercises, hanging. Cycles a lot so HS tight. Some lateral R calf numbness from laminectomy.  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 Yes: NPRS scale: 0 today up to 4 in midback/lowback /10 Pain location: mid and low back, in b/w scapula and sometimes into neck Pain description: tight, lower back pinch, upper back like I've been whacked Aggravating factors: bending, standing up Relieving factors: meds, massage table, stretching legs  PRECAUTIONS: None  RED FLAGS: None   WEIGHT BEARING RESTRICTIONS: No  FALLS:  Has patient fallen in last 6 months? No  LIVING ENVIRONMENT: Lives with: lives with their spouse Lives in: House/apartment  OCCUPATION: retired, cyclist  PLOF: Independent  PATIENT GOALS: reduce the pain  NEXT MD VISIT: none scheduled  OBJECTIVE:   DIAGNOSTIC FINDINGS:  03/02/20 AP lateral of the thoracic spine shows normal kyphotic curvature without  any evidence of compression fracture.  There are multilevel degenerative  disc changes mild.  He  does have significant cervical thoracic lumbar  scoliosis particularly in the thoracolumbar region where he has a  rightward convex curvature with rotatory component.  He has compensated  curve with fairly straight thoracic spine but begins to curve again going  into the cervical area.   Cervical AP and lateral shows normal lordotic curvature without listhesis.   There is some facet degenerative change in the upper cervical as well as  medical cervical area but nothing really high-grade.  Some uncovertebral  joint hypertrophy at C5-6.  There is mild compensatory scoliosis with a  rightward lean.   PATIENT SURVEYS:  FOTO 58 (61 predicted)  COGNITION: Overall cognitive status: Within functional limits for tasks assessed     SENSATION: Reports some numbness in R lateral calf since laminectomy  MUSCLE LENGTH: Marked HS, pirformis R>L, ITB, quads, L HF  POSTURE: forward head, posterior pelvic tilt, and R thoracic convexity scoliosis, flexed hips/knees, decreased cervical lordosis  PALPATION: HS insertion B, some tightness in left diaphragm compared to R (likely from scoliosis), Decreased lumbar/thoracic spinal mobility but no pain reported.  LUMBAR ROM:   AROM eval  Flexion Hands to just below knees*  Extension 10 flexes knees*  Right lateral flexion 10*  Left lateral flexion 10*  Right rotation 75%  Left rotation 60%   (Blank rows = not tested)  LOWER EXTREMITY MMT:     MMT  Right eval Left eval  Hip flexion 4+ 4+  Hip extension 5 5  Hip abduction 5 5  Hip adduction 5 5  Hip internal rotation    Hip external rotation    Knee flexion 5 5  Knee extension 5 5  Ankle dorsiflexion 5 5   (Blank rows = not tested)  LOWER EXTREMITY ROM:  B Knee extension limited by tight HS, else [facility]    TODAY'S TREATMENT:  DATE:   03/05/23 Long discussion of  current symptoms and did quick assessment for carpal tunnel and upper limb tension - all negative. Dynamic HS stretches x 20 B - standing and swinging leg Manual: Skilled palpation and monitoring of soft tissues during DN.  Trigger Point Dry-Needling  Treatment instructions: Expect mild to moderate muscle soreness. S/S of pneumothorax if dry needled over a lung field, and to seek immediate medical attention should they occur. Patient verbalized understanding of these instructions and education. Patient Consent Given: Yes Education handout provided: previously provided Muscles treated: B HS and lateral quads, L ADDuctors Electrical stimulation performed: No Parameters: N/A Treatment response/outcome: Twitch Response Elicited and Palpable Increase in Muscle Length  Skilled palpation and monitoring of soft tissues during DN.   03/01/23 Elliptical L2 Incline x 5 min B HS stretch 2x30 B Gastroc stretch at wall Supine serratus push B with blue band x 20 (did some in standing but caused low back tightness Kneeling toes in extension hands on heels 2x 30 sec hold for trunk extension and quad stretch Serratus push up on mat table modified plank position x 10 incline Manual: Skilled palpation and monitoring of soft tissues during DN. Trigger Point Dry-Needling  Treatment instructions: Expect mild to moderate muscle soreness. S/S of pneumothorax if dry needled over a lung field, and to seek immediate medical attention should they occur. Patient verbalized understanding of these instructions and education. Patient Consent Given: Yes Education handout provided: Yes Muscles treated: B lumbar multifidi (no R L4); R gluteals Electrical stimulation performed: No Parameters: N/A Treatment response/outcome: Twitch Response Elicited and Palpable Increase in Muscle Length   02/26/23 Elliptical L2 Incline x 5 min Standing L-T spine extension at raised mat table 2x 1 min with deep breaths Kneeling toes in  extension hands on heels 2x max hold for trunk extension and quad stretch Open Book x 5 breaths with prolonged hold on last rep Seated deep breathing with rib compression Diaphragm release in supine Serratus push R 7#  x 10 Serratus push up on mat table x 10 incline  Manual: Skilled palpation and monitoring of soft tissues during DN.  Trigger Point Dry-Needling  Treatment instructions: Expect mild to moderate muscle soreness. S/S of pneumothorax if dry needled over a lung field, and to seek immediate medical attention should they occur. Patient verbalized understanding of these instructions and education. Patient Consent Given: Yes Education handout provided: Yes Muscles treated: L lower throacic and lumbar multifidi Electrical stimulation performed: No Parameters: N/A Treatment response/outcome: Twitch Response Elicited and Palpable Increase in Muscle Length      PATIENT EDUCATION:  Education details: HEP update  Person educated: Patient Education method: Explanation, Demonstration, and Handouts Education comprehension: verbalized understanding and returned demonstration  HOME EXERCISE PROGRAM: Access Code: [email or web address] URL: [email or web address] Date: 02/22/2023 Prepared by: Raynelle Fanning  Exercises - Supine ITB Stretch with Strap  - 2 x daily - 7 x weekly - 1 sets - 3 reps - 60 sec hold - Supine Quadriceps Stretch with Strap on Table  - 2 x daily - 7 x weekly - 1 sets - 3 reps - 30-60 sec hold - Seated Hamstring Stretch  - 2 x daily - 7 x weekly - 1 sets - 3 reps - 30-60 sec hold - Sidelying Thoracic Rotation with Open Book  - 1 x daily - 7 x weekly - 2 sets - 5 reps - Sidelying Quadratus Lumborum Stretch on Table  - 1 x daily - 3-4 x weekly - 1  sets - 3 reps - 30-60 hold - Sidelying ITB Stretch off Table (Mirrored)  - 1 x daily - 7 x weekly - 1 sets - 3 reps - 1-5 min hold - QL Stretch Supine (Mirrored)  - 1 x daily - 7 x weekly - 1 sets - 3 reps - 30-60 hold  Patient  Education - Trigger Point Dry Needling  ASSESSMENT:  CLINICAL IMPRESSION: Patient presents today with ongoing overall pain in the mid back and low back. He reports onset of new symptoms x 1 week occurring every other day which are like N/T down lateral R LE to ankle.  He has not ridden his bike for a few days and did stretching instead and yoga. He feels some of the yoga poses are irritating his nerves. We addressed his marked tightness in B LE today to hopefully improve flexibility in the hips and take pressure off his back. Excellent response in B HS and lateral quads. He may benefit from ongoing DN/MT here next visit as well. We discussed ongoing need to use foam roller to assist in decreasing tissue tension. He also reported N/T in B forearms and hands so quick scan done for CTS and upper limb nerve tension. All were negative. He does demo trigger points in B infraspinatus which may be contributing to sx.   OBJECTIVE IMPAIRMENTS: decreased activity tolerance, decreased ROM, decreased strength, hypomobility, increased muscle spasms, impaired flexibility, postural dysfunction, and pain.   ACTIVITY LIMITATIONS: bending, sitting, and squatting  PARTICIPATION LIMITATIONS: cleaning  PERSONAL FACTORS: Age and Time since onset of injury/illness/exacerbation are also affecting patient's functional outcome.   REHAB POTENTIAL: Good  CLINICAL DECISION MAKING: Evolving/moderate complexity  EVALUATION COMPLEXITY: Moderate   GOALS: Goals reviewed with patient? Yes  SHORT TERM GOALS: Target date: 02/22/2023   Patient will be independent with initial HEP.  Baseline:  Goal status: IN PROGRESS - non compliant  2.  Decreased mid and low back pain by 30% with ADLS Baseline:  Goal status: IN PROGRESS 10-20%    LONG TERM GOALS: Target date: 03/22/2023   Patient will be independent with advanced/ongoing HEP to improve outcomes and carryover.  Baseline:  Goal status: INITIAL  2.  Patient will  report 75% improvement in low back pain with ADLs and sit to stand transfer to improve QOL.  Baseline:  Goal status: INITIAL  3.  Patient will demonstrate functional pain free ROM to perform ADLs and to check blind spots while biking.   Baseline:  Goal status: INITIAL  4.  Patient will demonstrate improved core strength to 4+/5 to stabilize posture. Baseline:  Goal status: INITIAL  5.  Patient will report 50 on lumbar FOTO to demonstrate improved functional ability.  Baseline: 58 Goal status: INITIAL   6.  Patient to demonstrate ability to achieve and maintain good spinal alignment/posturing and body mechanics needed for daily activities. Baseline:  Goal status: INITIAL  8. Patient will demonstrate improved HS and hip flexor flexibility to allow for neutral spine in standing. Baseline:  Goal status: INITIAL   PLAN:  PT FREQUENCY: 2x/week  PT DURATION: 8 weeks  PLANNED INTERVENTIONS: Therapeutic exercises, Therapeutic activity, Neuromuscular re-education, Balance training, Patient/Family education, Self Care, Joint mobilization, Aquatic Therapy, Dry Needling, Electrical stimulation, Spinal mobilization, Cryotherapy, Moist heat, and Manual therapy.  PLAN FOR NEXT SESSION: Assess response to DN and continue as indicated, Focus on flexibility; thoracic mobility, hip flexor/quad and abdominal flexibility, core and postural strengthening.    Solon Palm, PT  03/05/23 4:39 PM

## 2023-03-05 ENCOUNTER — Ambulatory Visit: Payer: Medicare Other | Admitting: Physical Therapy

## 2023-03-05 ENCOUNTER — Encounter: Payer: Self-pay | Admitting: Physical Therapy

## 2023-03-05 DIAGNOSIS — M546 Pain in thoracic spine: Secondary | ICD-10-CM

## 2023-03-05 DIAGNOSIS — M5459 Other low back pain: Secondary | ICD-10-CM

## 2023-03-05 DIAGNOSIS — R293 Abnormal posture: Secondary | ICD-10-CM

## 2023-03-08 ENCOUNTER — Encounter: Payer: Medicare Other | Admitting: Physical Therapy

## 2023-03-08 LAB — LAB REPORT - SCANNED
EGFR: 88
PSA, FREE: 0.62

## 2023-03-08 NOTE — Therapy (Signed)
OUTPATIENT PHYSICAL THERAPY THORACOLUMBAR TREATMENT   Patient Name: Mason Irwin MRN: [id number] DOB:07-30-1955, 67 y.o., male Today's Date: 03/11/2023  END OF SESSION:  PT End of Session - 03/11/23 0847     Visit Number 8    Date for PT Re-Evaluation 03/22/23    Authorization Type MCR    Progress Note Due on Visit 10    PT Start Time 0847    PT Stop Time 0930    PT Time Calculation (min) 43 min    Activity Tolerance Patient tolerated treatment well    Behavior During Therapy WFL for tasks assessed/performed                    Past Medical History:  Diagnosis Date   Allergy    Anxiety    Eczema    hands, chronic and recurrent   Family history of prostate cancer in father    FH: early coronary artery disease    Hemorrhoids    Hyperlipidemia    Hyperplastic colon polyp    Pure hypercholesterolemia    S/P appendectomy    S/P bilateral inguinal hernia repair    38 weeks old    Small intestinal bacterial overgrowth (SIBO)    Past Surgical History:  Procedure Laterality Date   APPENDECTOMY     age 4 weeks   COLONOSCOPY     INGUINAL HERNIA REPAIR Bilateral    with appendectomy, age 17 weeks   LUMBAR DISC SURGERY     L4-L5   Patient Active Problem List   Diagnosis Date Noted   Morton's neuroma, right 04/05/2021   Injury of right suprascapular nerve 03/02/2021   Small intestinal bacterial overgrowth (SIBO) 10/20/2020   Fracture of shaft of clavicle, closed 01/14/2020   Routine general medical examination at a health care facility 09/29/2013   Low back pain 10/04/2011   HSV infection 07/10/2011   DERMATITIS, CONTACT, NOS 03/17/2007    PCP: Farris Has, MD   REFERRING PROVIDER: Dawley, Alan Mulder, DO   REFERRING DIAG: M41.9 (ICD-10-CM) - Scoliosis, unspecified   Rationale for Evaluation and Treatment: Rehabilitation  THERAPY DIAG:  Other low back pain  Pain in thoracic spine  Abnormal posture  ONSET DATE: 2021  SUBJECTIVE:                                                                                                                                                                                            SUBJECTIVE STATEMENT: [facility]. I had been working on bikes for 10 hours the previous day and couldn't even stand up. Then needling made  the muscles looser. No significant change in ROM.  I feel like therapy is helping.   Eval: Pain is in midsection of back and down left side wraps around toward front. Also has pain at L4/5 and keeps him from straightening up. Had a fall in 2021 and broke ribs and clavicle. Since then pain has worsened.  He has winging of R scapula (one MD thinks it was nerve damage from the fall). LBP - bending, scrubbing, standing up. Mid back is more with sitting. Has been working on squats and Lawyer. Breathing exercises, hanging. Cycles a lot so HS tight. Some lateral R calf numbness from laminectomy.  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 Yes: NPRS scale: 0 today up to 4 in midback/lowback /10 Pain location: mid and low back, in b/w scapula and sometimes into neck Pain description: tight, lower back pinch, upper back like I've been whacked Aggravating factors: bending, standing up Relieving factors: meds, massage table, stretching legs  PRECAUTIONS: None  RED FLAGS: None   WEIGHT BEARING RESTRICTIONS: No  FALLS:  Has patient fallen in last 6 months? No  LIVING ENVIRONMENT: Lives with: lives with their spouse Lives in: House/apartment  OCCUPATION: retired, cyclist  PLOF: Independent  PATIENT GOALS: reduce the pain  NEXT MD VISIT: none scheduled  OBJECTIVE:   DIAGNOSTIC FINDINGS:  03/02/20 AP lateral of the thoracic spine shows normal kyphotic curvature without  any evidence of compression fracture.  There are multilevel degenerative  disc changes mild.   He does have significant cervical thoracic lumbar  scoliosis particularly in the thoracolumbar region where he has a  rightward convex curvature with rotatory component.  He has compensated  curve with fairly straight thoracic spine but begins to curve again going  into the cervical area.   Cervical AP and lateral shows normal lordotic curvature without listhesis.   There is some facet degenerative change in the upper cervical as well as  medical cervical area but nothing really high-grade.  Some uncovertebral  joint hypertrophy at C5-6.  There is mild compensatory scoliosis with a  rightward lean.   PATIENT SURVEYS:  FOTO 58 (61 predicted)  COGNITION: Overall cognitive status: Within functional limits for tasks assessed     SENSATION: Reports some numbness in R lateral calf since laminectomy  MUSCLE LENGTH: Marked HS, pirformis R>L, ITB, quads, L HF  POSTURE: forward head, posterior pelvic tilt, and R thoracic convexity scoliosis, flexed hips/knees, decreased cervical lordosis  PALPATION: HS insertion B, some tightness in left diaphragm compared to R (likely from scoliosis), Decreased lumbar/thoracic spinal mobility but no pain reported.  LUMBAR ROM:   AROM eval  Flexion Hands to just below knees*  Extension 10 flexes knees*  Right lateral flexion 10*  Left lateral flexion 10*  Right rotation 75%  Left rotation 60%   (Blank rows = not tested)  LOWER EXTREMITY MMT:     MMT  Right eval Left eval  Hip flexion 4+ 4+  Hip extension 5 5  Hip abduction 5 5  Hip adduction 5 5  Hip internal rotation    Hip external rotation    Knee flexion 5 5  Knee extension 5 5  Ankle dorsiflexion 5 5   (Blank rows = not tested)  LOWER EXTREMITY ROM:  B Knee extension limited by tight HS, else [facility]    TODAY'S TREATMENT:  DATE:    03/08/23 Bike L3 x 6  minutes Seated rows 25# 1x10 Standing Rows 20# x 10 Lat pull 45# 2x12 Diagonals B theraband 2 x 10 B Manual: Skilled palpation and monitoring of soft tissues during DN. STM to B UT. Levator and infraspinatus muscles. rigger Point Dry-Needling  Treatment instructions: Expect mild to moderate muscle soreness. S/S of pneumothorax if dry needled over a lung field, and to seek immediate medical attention should they occur. Patient verbalized understanding of these instructions and education. Patient Consent Given: Yes Education handout provided: previously provided Muscles treated: B UT, R rhomboids, B Infraspinatus Electrical stimulation performed: No Parameters: N/A Treatment response/outcome: Twitch Response Elicited and Palpable Increase in Muscle Length    03/05/23 Long discussion of current symptoms and did quick assessment for carpal tunnel and upper limb tension - all negative. Dynamic HS stretches x 20 B - standing and swinging leg Manual: Skilled palpation and monitoring of soft tissues during DN.  Trigger Point Dry-Needling  Treatment instructions: Expect mild to moderate muscle soreness. S/S of pneumothorax if dry needled over a lung field, and to seek immediate medical attention should they occur. Patient verbalized understanding of these instructions and education. Patient Consent Given: Yes Education handout provided: previously provided Muscles treated: B HS and lateral quads, L ADDuctors Electrical stimulation performed: No Parameters: N/A Treatment response/outcome: Twitch Response Elicited and Palpable Increase in Muscle Length  Skilled palpation and monitoring of soft tissues during DN.   03/01/23 Elliptical L2 Incline x 5 min B HS stretch 2x30 B Gastroc stretch at wall Supine serratus push B with blue band x 20 (did some in standing but caused low back tightness Kneeling toes in extension hands on heels 2x 30 sec hold for trunk extension and quad stretch Serratus  push up on mat table modified plank position x 10 incline Manual: Skilled palpation and monitoring of soft tissues during DN. Trigger Point Dry-Needling  Treatment instructions: Expect mild to moderate muscle soreness. S/S of pneumothorax if dry needled over a lung field, and to seek immediate medical attention should they occur. Patient verbalized understanding of these instructions and education. Patient Consent Given: Yes Education handout provided: Yes Muscles treated: B lumbar multifidi (no R L4); R gluteals Electrical stimulation performed: No Parameters: N/A Treatment response/outcome: Twitch Response Elicited and Palpable Increase in Muscle Length   02/26/23 Elliptical L2 Incline x 5 min Standing L-T spine extension at raised mat table 2x 1 min with deep breaths Kneeling toes in extension hands on heels 2x max hold for trunk extension and quad stretch Open Book x 5 breaths with prolonged hold on last rep Seated deep breathing with rib compression Diaphragm release in supine Serratus push R 7#  x 10 Serratus push up on mat table x 10 incline  Manual: Skilled palpation and monitoring of soft tissues during DN.  Trigger Point Dry-Needling  Treatment instructions: Expect mild to moderate muscle soreness. S/S of pneumothorax if dry needled over a lung field, and to seek immediate medical attention should they occur. Patient verbalized understanding of these instructions and education. Patient Consent Given: Yes Education handout provided: Yes Muscles treated: L lower throacic and lumbar multifidi Electrical stimulation performed: No Parameters: N/A Treatment response/outcome: Twitch Response Elicited and Palpable Increase in Muscle Length      PATIENT EDUCATION:  Education details: HEP update  Person educated: Patient Education method: Explanation, Demonstration, and Handouts Education comprehension: verbalized understanding and returned demonstration  HOME EXERCISE  PROGRAM: Access Code: [email or web address] URL: [email or web address]  Date: 02/22/2023 Prepared by: Raynelle Fanning  Exercises - Supine ITB Stretch with Strap  - 2 x daily - 7 x weekly - 1 sets - 3 reps - 60 sec hold - Supine Quadriceps Stretch with Strap on Table  - 2 x daily - 7 x weekly - 1 sets - 3 reps - 30-60 sec hold - Seated Hamstring Stretch  - 2 x daily - 7 x weekly - 1 sets - 3 reps - 30-60 sec hold - Sidelying Thoracic Rotation with Open Book  - 1 x daily - 7 x weekly - 2 sets - 5 reps - Sidelying Quadratus Lumborum Stretch on Table  - 1 x daily - 3-4 x weekly - 1 sets - 3 reps - 30-60 hold - Sidelying ITB Stretch off Table (Mirrored)  - 1 x daily - 7 x weekly - 1 sets - 3 reps - 1-5 min hold - QL Stretch Supine (Mirrored)  - 1 x daily - 7 x weekly - 1 sets - 3 reps - 30-60 hold  Patient Education - Trigger Point Dry Needling  ASSESSMENT:  CLINICAL IMPRESSION: Keeyon reports he feels that PT is helping him,but he continues to have full body soreness with activities including gentle yoga, biking and working at home on projects. We discussed his diet today and the role of certain foods in causing inflammation in the body. He does eat a lot of red meat. He is fairly low on sugar. He stated he had some R upper back twitching this morning and continues to report N/T in B hands. We focused on upper back strengthening today and he is aware to try to keep his upper traps relaxed. Good response to DN today in the neck and upper traps. Very tight in R UT and levator. Addressed infraspinatus to see if it would have a positive effect on hand symptoms.   OBJECTIVE IMPAIRMENTS: decreased activity tolerance, decreased ROM, decreased strength, hypomobility, increased muscle spasms, impaired flexibility, postural dysfunction, and pain.   ACTIVITY LIMITATIONS: bending, sitting, and squatting  PARTICIPATION LIMITATIONS: cleaning  PERSONAL FACTORS: Age and Time since onset of  injury/illness/exacerbation are also affecting patient's functional outcome.   REHAB POTENTIAL: Good  CLINICAL DECISION MAKING: Evolving/moderate complexity  EVALUATION COMPLEXITY: Moderate   GOALS: Goals reviewed with patient? Yes  SHORT TERM GOALS: Target date: 02/22/2023   Patient will be independent with initial HEP.  Baseline:  Goal status: IN PROGRESS - non compliant  2.  Decreased mid and low back pain by 30% with ADLS Baseline:  Goal status: IN PROGRESS 10-20%    LONG TERM GOALS: Target date: 03/22/2023   Patient will be independent with advanced/ongoing HEP to improve outcomes and carryover.  Baseline:  Goal status: INITIAL  2.  Patient will report 75% improvement in low back pain with ADLs and sit to stand transfer to improve QOL.  Baseline:  Goal status: INITIAL  3.  Patient will demonstrate functional pain free ROM to perform ADLs and to check blind spots while biking.   Baseline:  Goal status: INITIAL  4.  Patient will demonstrate improved core strength to 4+/5 to stabilize posture. Baseline:  Goal status: INITIAL  5.  Patient will report 69 on lumbar FOTO to demonstrate improved functional ability.  Baseline: 58 Goal status: INITIAL   6.  Patient to demonstrate ability to achieve and maintain good spinal alignment/posturing and body mechanics needed for daily activities. Baseline:  Goal status: INITIAL  8. Patient will demonstrate improved HS and hip flexor  flexibility to allow for neutral spine in standing. Baseline:  Goal status: INITIAL   PLAN:  PT FREQUENCY: 2x/week  PT DURATION: 8 weeks  PLANNED INTERVENTIONS: Therapeutic exercises, Therapeutic activity, Neuromuscular re-education, Balance training, Patient/Family education, Self Care, Joint mobilization, Aquatic Therapy, Dry Needling, Electrical stimulation, Spinal mobilization, Cryotherapy, Moist heat, and Manual therapy.  PLAN FOR NEXT SESSION: Assess response to DN and continue as  indicated, Check LTGs. Focus on flexibility; thoracic mobility, hip flexor/quad and abdominal flexibility, core and postural strengthening.    Solon Palm, PT  03/11/23 9:32 AM

## 2023-03-11 ENCOUNTER — Ambulatory Visit: Payer: Medicare Other | Admitting: Physical Therapy

## 2023-03-11 ENCOUNTER — Encounter: Payer: Self-pay | Admitting: Physical Therapy

## 2023-03-11 DIAGNOSIS — M5459 Other low back pain: Secondary | ICD-10-CM

## 2023-03-11 DIAGNOSIS — M546 Pain in thoracic spine: Secondary | ICD-10-CM

## 2023-03-11 DIAGNOSIS — R293 Abnormal posture: Secondary | ICD-10-CM

## 2023-03-15 ENCOUNTER — Ambulatory Visit: Payer: Medicare Other | Attending: Neurological Surgery | Admitting: Physical Therapy

## 2023-03-15 DIAGNOSIS — R293 Abnormal posture: Secondary | ICD-10-CM | POA: Diagnosis present

## 2023-03-15 DIAGNOSIS — M546 Pain in thoracic spine: Secondary | ICD-10-CM | POA: Diagnosis present

## 2023-03-15 DIAGNOSIS — M5459 Other low back pain: Secondary | ICD-10-CM | POA: Diagnosis present

## 2023-03-15 NOTE — Therapy (Signed)
OUTPATIENT PHYSICAL THERAPY THORACOLUMBAR TREATMENT   Patient Name: Mason Irwin MRN: [id number] DOB:04-Jan-1956, 67 y.o., male Today's Date: 03/15/2023  END OF SESSION:  PT End of Session - 03/15/23 0800     Visit Number 9    Date for PT Re-Evaluation 03/22/23    Authorization Type MCR    Progress Note Due on Visit 10    PT Start Time 0800    PT Stop Time 0840    PT Time Calculation (min) 40 min    Activity Tolerance Patient tolerated treatment well                    Past Medical History:  Diagnosis Date   Allergy    Anxiety    Eczema    hands, chronic and recurrent   Family history of prostate cancer in father    FH: early coronary artery disease    Hemorrhoids    Hyperlipidemia    Hyperplastic colon polyp    Pure hypercholesterolemia    S/P appendectomy    S/P bilateral inguinal hernia repair    42 weeks old    Small intestinal bacterial overgrowth (SIBO)    Past Surgical History:  Procedure Laterality Date   APPENDECTOMY     age 49 weeks   COLONOSCOPY     INGUINAL HERNIA REPAIR Bilateral    with appendectomy, age 59 weeks   LUMBAR DISC SURGERY     L4-L5   Patient Active Problem List   Diagnosis Date Noted   Morton's neuroma, right 04/05/2021   Injury of right suprascapular nerve 03/02/2021   Small intestinal bacterial overgrowth (SIBO) 10/20/2020   Fracture of shaft of clavicle, closed 01/14/2020   Routine general medical examination at a health care facility 09/29/2013   Low back pain 10/04/2011   HSV infection 07/10/2011   DERMATITIS, CONTACT, NOS 03/17/2007    PCP: Farris Has, MD   REFERRING PROVIDER: Dawley, Alan Mulder, DO   REFERRING DIAG: M41.9 (ICD-10-CM) - Scoliosis, unspecified   Rationale for Evaluation and Treatment: Rehabilitation  THERAPY DIAG:  Other low back pain  Pain in thoracic spine  Abnormal posture  ONSET DATE: 2021  SUBJECTIVE:                                                                                                                                                                                            SUBJECTIVE STATEMENT: The DN to UT really showed me I shrug my shoulders.  I feel it in my lower back it still pinches me.  Any activity will make the low back pain go to a 5/10.  It's hard to stand up straight. Pinch on left QL been acting up this week.  Right lower leg change in sensation worsening  and that right foot goes to sleep.  I do think PT has helped me.  Has an inversion table at home  Eval: Pain is in midsection of back and down left side wraps around toward front. Also has pain at L4/5 and keeps him from straightening up. Had a fall in 2021 and broke ribs and clavicle. Since then pain has worsened.  He has winging of R scapula (one MD thinks it was nerve damage from the fall). LBP - bending, scrubbing, standing up. Mid back is more with sitting. Has been working on squats and Lawyer. Breathing exercises, hanging. Cycles a lot so HS tight. Some lateral R calf numbness from laminectomy.  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 Yes: NPRS scale: 3 in lowback /10 Pain location: mid and low back, in b/w scapula and sometimes into neck Pain description: tight, lower back pinch, upper back like I've been whacked Aggravating factors: bending, standing up Relieving factors: meds, massage table, stretching legs  PRECAUTIONS: None  RED FLAGS: None   WEIGHT BEARING RESTRICTIONS: No  FALLS:  Has patient fallen in last 6 months? No  LIVING ENVIRONMENT: Lives with: lives with their spouse Lives in: House/apartment  OCCUPATION: retired, cyclist  PLOF: Independent  PATIENT GOALS: reduce the pain  NEXT MD VISIT: none scheduled  OBJECTIVE:   DIAGNOSTIC FINDINGS:  03/02/20 AP lateral of the thoracic spine shows normal kyphotic curvature without  any evidence of  compression fracture.  There are multilevel degenerative  disc changes mild.  He does have significant cervical thoracic lumbar  scoliosis particularly in the thoracolumbar region where he has a  rightward convex curvature with rotatory component.  He has compensated  curve with fairly straight thoracic spine but begins to curve again going  into the cervical area.   Cervical AP and lateral shows normal lordotic curvature without listhesis.   There is some facet degenerative change in the upper cervical as well as  medical cervical area but nothing really high-grade.  Some uncovertebral  joint hypertrophy at C5-6.  There is mild compensatory scoliosis with a  rightward lean.   PATIENT SURVEYS:  FOTO 58 (61 predicted)  COGNITION: Overall cognitive status: Within functional limits for tasks assessed     SENSATION: Reports some numbness in R lateral calf since laminectomy  MUSCLE LENGTH: Marked HS, pirformis R>L, ITB, quads, L HF  POSTURE: forward head, posterior pelvic tilt, and R thoracic convexity scoliosis, flexed hips/knees, decreased cervical lordosis  PALPATION: HS insertion B, some tightness in left diaphragm compared to R (likely from scoliosis), Decreased lumbar/thoracic spinal mobility but no pain reported.  LUMBAR ROM:   AROM eval  Flexion Hands to just below knees*  Extension 10 flexes knees*  Right lateral flexion 10*  Left lateral flexion 10*  Right rotation 75%  Left rotation 60%   (Blank rows = not tested)  LOWER EXTREMITY MMT:     MMT  Right eval Left eval  Hip flexion 4+ 4+  Hip extension 5 5  Hip abduction 5 5  Hip adduction 5 5  Hip internal rotation    Hip external rotation    Knee flexion 5 5  Knee extension 5 5  Ankle dorsiflexion 5 5   (  Blank rows = not tested)  LOWER EXTREMITY ROM:  B Knee extension limited by tight HS, else [facility]    TODAY'S TREATMENT:                                                                                                                               DATE:   10/4: Nu-Step 10 minutes while discussing status and current symptoms Patient interested in purchasing a foam roll and we discussed uses including vertical placement along length of spine Open books with top leg anchored on foam roll 10x right/left Discussed McKenzie cervical roll for night time Manual: Skilled palpation and monitoring of soft tissues during DN; pelvic distraction Trigger Point Dry-Needling  Treatment instructions: Expect mild to moderate muscle soreness. S/S of pneumothorax if dry needled over a lung field, and to seek immediate medical attention should they occur. Patient verbalized understanding of these instructions and education. Patient Consent Given: Yes Education handout provided: previously provided Muscles treated: sidelying left QL; prone B lumbar multifidi Electrical stimulation performed: No Parameters: N/A Treatment response/outcome: Twitch Response Elicited and Palpable Increase in Muscle Length  03/08/23 Bike L3 x 6 minutes Seated rows 25# 1x10 Standing Rows 20# x 10 Lat pull 45# 2x12 Diagonals B theraband 2 x 10 B Manual: Skilled palpation and monitoring of soft tissues during DN. STM to B UT. Levator and infraspinatus muscles. rigger Point Dry-Needling  Treatment instructions: Expect mild to moderate muscle soreness. S/S of pneumothorax if dry needled over a lung field, and to seek immediate medical attention should they occur. Patient verbalized understanding of these instructions and education. Patient Consent Given: Yes Education handout provided: previously provided Muscles treated: B UT, R rhomboids, B Infraspinatus Electrical stimulation performed: No Parameters: N/A Treatment response/outcome: Twitch Response Elicited and Palpable Increase in Muscle Length    03/05/23 Long discussion of current symptoms and did quick assessment for carpal tunnel and upper limb tension - all negative. Dynamic HS  stretches x 20 B - standing and swinging leg Manual: Skilled palpation and monitoring of soft tissues during DN.  Trigger Point Dry-Needling  Treatment instructions: Expect mild to moderate muscle soreness. S/S of pneumothorax if dry needled over a lung field, and to seek immediate medical attention should they occur. Patient verbalized understanding of these instructions and education. Patient Consent Given: Yes Education handout provided: previously provided Muscles treated: B HS and lateral quads, L ADDuctors Electrical stimulation performed: No Parameters: N/A Treatment response/outcome: Twitch Response Elicited and Palpable Increase in Muscle Length  Skilled palpation and monitoring of soft tissues during DN.   03/01/23 Elliptical L2 Incline x 5 min B HS stretch 2x30 B Gastroc stretch at wall Supine serratus push B with blue band x 20 (did some in standing but caused low back tightness Kneeling toes in extension hands on heels 2x 30 sec hold for trunk extension and quad stretch Serratus push up on mat table modified plank position x 10 incline Manual: Skilled  palpation and monitoring of soft tissues during DN. Trigger Point Dry-Needling  Treatment instructions: Expect mild to moderate muscle soreness. S/S of pneumothorax if dry needled over a lung field, and to seek immediate medical attention should they occur. Patient verbalized understanding of these instructions and education. Patient Consent Given: Yes Education handout provided: Yes Muscles treated: B lumbar multifidi (no R L4); R gluteals Electrical stimulation performed: No Parameters: N/A Treatment response/outcome: Twitch Response Elicited and Palpable Increase in Muscle Length   02/26/23 Elliptical L2 Incline x 5 min Standing L-T spine extension at raised mat table 2x 1 min with deep breaths Kneeling toes in extension hands on heels 2x max hold for trunk extension and quad stretch Open Book x 5 breaths with prolonged  hold on last rep Seated deep breathing with rib compression Diaphragm release in supine Serratus push R 7#  x 10 Serratus push up on mat table x 10 incline  Manual: Skilled palpation and monitoring of soft tissues during DN.  Trigger Point Dry-Needling  Treatment instructions: Expect mild to moderate muscle soreness. S/S of pneumothorax if dry needled over a lung field, and to seek immediate medical attention should they occur. Patient verbalized understanding of these instructions and education. Patient Consent Given: Yes Education handout provided: Yes Muscles treated: L lower throacic and lumbar multifidi Electrical stimulation performed: No Parameters: N/A Treatment response/outcome: Twitch Response Elicited and Palpable Increase in Muscle Length      PATIENT EDUCATION:  Education details: HEP update  Person educated: Patient Education method: Explanation, Demonstration, and Handouts Education comprehension: verbalized understanding and returned demonstration  HOME EXERCISE PROGRAM: Access Code: [email or web address] URL: [email or web address] Date: 02/22/2023 Prepared by: Raynelle Fanning  Exercises - Supine ITB Stretch with Strap  - 2 x daily - 7 x weekly - 1 sets - 3 reps - 60 sec hold - Supine Quadriceps Stretch with Strap on Table  - 2 x daily - 7 x weekly - 1 sets - 3 reps - 30-60 sec hold - Seated Hamstring Stretch  - 2 x daily - 7 x weekly - 1 sets - 3 reps - 30-60 sec hold - Sidelying Thoracic Rotation with Open Book  - 1 x daily - 7 x weekly - 2 sets - 5 reps - Sidelying Quadratus Lumborum Stretch on Table  - 1 x daily - 3-4 x weekly - 1 sets - 3 reps - 30-60 hold - Sidelying ITB Stretch off Table (Mirrored)  - 1 x daily - 7 x weekly - 1 sets - 3 reps - 1-5 min hold - QL Stretch Supine (Mirrored)  - 1 x daily - 7 x weekly - 1 sets - 3 reps - 30-60 hold  Patient Education - Trigger Point Dry Needling  ASSESSMENT:  CLINICAL IMPRESSION: The patient has a positive  initial response to DN of left QL and bil lumbar multifidi.  The patient was encouraged in regular performance of HEP post DN including soft tissue lengthening especially QL and strengthening exercises to enhance long term benefit.  He may benefit from the addition of ES to DN for further physiologic changes to muscle spindles and neural pathways. Therapist monitoring response to all interventions and modifying treatment accordingly.      OBJECTIVE IMPAIRMENTS: decreased activity tolerance, decreased ROM, decreased strength, hypomobility, increased muscle spasms, impaired flexibility, postural dysfunction, and pain.   ACTIVITY LIMITATIONS: bending, sitting, and squatting  PARTICIPATION LIMITATIONS: cleaning  PERSONAL FACTORS: Age and Time since onset of injury/illness/exacerbation are also affecting patient's functional outcome.  REHAB POTENTIAL: Good  CLINICAL DECISION MAKING: Evolving/moderate complexity  EVALUATION COMPLEXITY: Moderate   GOALS: Goals reviewed with patient? Yes  SHORT TERM GOALS: Target date: 02/22/2023   Patient will be independent with initial HEP.  Baseline:  Goal status: IN PROGRESS - non compliant  2.  Decreased mid and low back pain by 30% with ADLS Baseline:  Goal status: IN PROGRESS 10-20%    LONG TERM GOALS: Target date: 03/22/2023   Patient will be independent with advanced/ongoing HEP to improve outcomes and carryover.  Baseline:  Goal status: INITIAL  2.  Patient will report 75% improvement in low back pain with ADLs and sit to stand transfer to improve QOL.  Baseline:  Goal status: INITIAL  3.  Patient will demonstrate functional pain free ROM to perform ADLs and to check blind spots while biking.   Baseline:  Goal status: INITIAL  4.  Patient will demonstrate improved core strength to 4+/5 to stabilize posture. Baseline:  Goal status: INITIAL  5.  Patient will report 36 on lumbar FOTO to demonstrate improved functional ability.   Baseline: 58 Goal status: INITIAL   6.  Patient to demonstrate ability to achieve and maintain good spinal alignment/posturing and body mechanics needed for daily activities. Baseline:  Goal status: INITIAL  8. Patient will demonstrate improved HS and hip flexor flexibility to allow for neutral spine in standing. Baseline:  Goal status: INITIAL   PLAN:  PT FREQUENCY: 2x/week  PT DURATION: 8 weeks  PLANNED INTERVENTIONS: Therapeutic exercises, Therapeutic activity, Neuromuscular re-education, Balance training, Patient/Family education, Self Care, Joint mobilization, Aquatic Therapy, Dry Needling, Electrical stimulation, Spinal mobilization, Cryotherapy, Moist heat, and Manual therapy.  PLAN FOR NEXT SESSION: 10th visit; check goals; Assess response to DN and continue as indicated, may add ES to multifidi;  Focus on flexibility; thoracic mobility, hip flexor/quad and abdominal flexibility, core and postural strengthening.   Lavinia Sharps, PT 03/15/23 10:28 AM Phone: [phone] Fax: [phone]

## 2023-03-19 ENCOUNTER — Ambulatory Visit: Payer: Medicare Other | Admitting: Physical Therapy

## 2023-03-19 DIAGNOSIS — M546 Pain in thoracic spine: Secondary | ICD-10-CM

## 2023-03-19 DIAGNOSIS — M5459 Other low back pain: Secondary | ICD-10-CM

## 2023-03-19 NOTE — Therapy (Signed)
OUTPATIENT PHYSICAL THERAPY THORACOLUMBAR TREATMENT   Patient Name: Mason Irwin MRN: [id number] DOB:21-Feb-1956, 67 y.o., male Today's Date: 03/19/2023  Progress Note Reporting Period 01/25/23 to 03/19/23  See note below for Objective Data and Assessment of Progress/Goals.     END OF SESSION:  PT End of Session - 03/19/23 1537     Visit Number 10    Date for PT Re-Evaluation 03/22/23    Authorization Type MCR    Progress Note Due on Visit 10    PT Start Time 1537    PT Stop Time 1615    PT Time Calculation (min) 38 min    Activity Tolerance Patient tolerated treatment well                    Past Medical History:  Diagnosis Date   Allergy    Anxiety    Eczema    hands, chronic and recurrent   Family history of prostate cancer in father    FH: early coronary artery disease    Hemorrhoids    Hyperlipidemia    Hyperplastic colon polyp    Pure hypercholesterolemia    S/P appendectomy    S/P bilateral inguinal hernia repair    64 weeks old    Small intestinal bacterial overgrowth (SIBO)    Past Surgical History:  Procedure Laterality Date   APPENDECTOMY     age 64 weeks   COLONOSCOPY     INGUINAL HERNIA REPAIR Bilateral    with appendectomy, age 65 weeks   LUMBAR DISC SURGERY     L4-L5   Patient Active Problem List   Diagnosis Date Noted   Morton's neuroma, right 04/05/2021   Injury of right suprascapular nerve 03/02/2021   Small intestinal bacterial overgrowth (SIBO) 10/20/2020   Fracture of shaft of clavicle, closed 01/14/2020   Routine general medical examination at a health care facility 09/29/2013   Low back pain 10/04/2011   HSV infection 07/10/2011   DERMATITIS, CONTACT, NOS 03/17/2007    PCP: Farris Has, MD   REFERRING PROVIDER: Dawley, Alan Mulder, DO   REFERRING DIAG: M41.9 (ICD-10-CM) - Scoliosis, unspecified   Rationale for Evaluation and Treatment: Rehabilitation  THERAPY DIAG:  Other low back pain  Pain in thoracic  spine  ONSET DATE: 2021  SUBJECTIVE:                                                                                                                                                                                           SUBJECTIVE STATEMENT:  The DN in my lower spine helped.  I had a yoga session yesterday.  More  flexible than usual.  Did some calisthenics yesterday. Been washing cars.  Some pain right QL.  30% improvement.    Has an inversion table at home  Eval: Pain is in midsection of back and down left side wraps around toward front. Also has pain at L4/5 and keeps him from straightening up. Had a fall in 2021 and broke ribs and clavicle. Since then pain has worsened.  He has winging of R scapula (one MD thinks it was nerve damage from the fall). LBP - bending, scrubbing, standing up. Mid back is more with sitting. Has been working on squats and Lawyer. Breathing exercises, hanging. Cycles a lot so HS tight. Some lateral R calf numbness from laminectomy.  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 Yes: NPRS scale:  in right lowback 3/10 Pain location: right low back Pain description: tight, lower back pinch, upper back like I've been whacked Aggravating factors: bending, standing up Relieving factors: meds, massage table, stretching legs  PRECAUTIONS: None  RED FLAGS: None   WEIGHT BEARING RESTRICTIONS: No  FALLS:  Has patient fallen in last 6 months? No  LIVING ENVIRONMENT: Lives with: lives with their spouse Lives in: House/apartment  OCCUPATION: retired, cyclist  PLOF: Independent  PATIENT GOALS: reduce the pain  NEXT MD VISIT: none scheduled  OBJECTIVE:   DIAGNOSTIC FINDINGS:  03/02/20 AP lateral of the thoracic spine shows normal kyphotic curvature without  any evidence of compression fracture.  There are multilevel degenerative  disc changes  mild.  He does have significant cervical thoracic lumbar  scoliosis particularly in the thoracolumbar region where he has a  rightward convex curvature with rotatory component.  He has compensated  curve with fairly straight thoracic spine but begins to curve again going  into the cervical area.   Cervical AP and lateral shows normal lordotic curvature without listhesis.   There is some facet degenerative change in the upper cervical as well as  medical cervical area but nothing really high-grade.  Some uncovertebral  joint hypertrophy at C5-6.  There is mild compensatory scoliosis with a  rightward lean.   PATIENT SURVEYS:  FOTO 58 (61 predicted) 10/8:  51  COGNITION: Overall cognitive status: Within functional limits for tasks assessed     SENSATION: Reports some numbness in R lateral calf since laminectomy  MUSCLE LENGTH: Marked HS, pirformis R>L, ITB, quads, L HF  POSTURE: forward head, posterior pelvic tilt, and R thoracic convexity scoliosis, flexed hips/knees, decreased cervical lordosis  PALPATION: HS insertion B, some tightness in left diaphragm compared to R (likely from scoliosis), Decreased lumbar/thoracic spinal mobility but no pain reported.  LUMBAR ROM:   AROM eval 10/8  Flexion Hands to just below knees* Hands to ankles  Extension 10 flexes knees* 10 degrees  Right lateral flexion 10* 10  Left lateral flexion 10* 10  Right rotation 75%   Left rotation 60%    (Blank rows = not tested)  LOWER EXTREMITY MMT:     MMT  Right eval Left eval 10/8  Hip flexion 4+ 4+ 5  Hip extension 5 5 5   Hip abduction 5 5 5   Hip adduction 5 5 5   Hip internal rotation     Hip external rotation     Knee flexion 5 5 5   Knee extension 5 5 5   Ankle dorsiflexion 5 5 5    (Blank rows =  not tested)  LOWER EXTREMITY ROM:  B Knee extension limited by tight HS, else [facility]    TODAY'S TREATMENT:                                                                                                                               DATE:   10/8: Lumbar ROM SLS (no pelvic drop) FOTO Hip hinge technique for functional bending/stooping Manual: Skilled palpation and monitoring of soft tissues during DN; pelvic distraction Trigger Point Dry-Needling  Treatment instructions: Expect mild to moderate muscle soreness. S/S of pneumothorax if dry needled over a lung field, and to seek immediate medical attention should they occur. Patient verbalized understanding of these instructions and education. Patient Consent Given: Yes Education handout provided: previously provided Muscles treated: sidelying right QL; prone B lumbar multifidi Electrical stimulation performed: yes bil lumbar multifidi Parameters: 1.5 ma 8 min Treatment response/outcome: Twitch Response Elicited and Palpable Increase in Muscle Length  10/4: Nu-Step 10 minutes while discussing status and current symptoms Patient interested in purchasing a foam roll and we discussed uses including vertical placement along length of spine Open books with top leg anchored on foam roll 10x right/left Discussed McKenzie cervical roll for night time Manual: Skilled palpation and monitoring of soft tissues during DN; pelvic distraction Trigger Point Dry-Needling  Treatment instructions: Expect mild to moderate muscle soreness. S/S of pneumothorax if dry needled over a lung field, and to seek immediate medical attention should they occur. Patient verbalized understanding of these instructions and education. Patient Consent Given: Yes Education handout provided: previously provided Muscles treated: sidelying left QL; prone B lumbar multifidi Electrical stimulation performed: No Parameters: N/A Treatment response/outcome: Twitch Response Elicited and Palpable Increase in Muscle Length  03/08/23 Bike L3 x 6 minutes Seated rows 25# 1x10 Standing Rows 20# x 10 Lat pull 45# 2x12 Diagonals B theraband 2 x 10 B Manual: Skilled palpation and  monitoring of soft tissues during DN. STM to B UT. Levator and infraspinatus muscles. rigger Point Dry-Needling  Treatment instructions: Expect mild to moderate muscle soreness. S/S of pneumothorax if dry needled over a lung field, and to seek immediate medical attention should they occur. Patient verbalized understanding of these instructions and education. Patient Consent Given: Yes Education handout provided: previously provided Muscles treated: B UT, R rhomboids, B Infraspinatus Electrical stimulation performed: No Parameters: N/A Treatment response/outcome: Twitch Response Elicited and Palpable Increase in Muscle Length    03/05/23 Long discussion of current symptoms and did quick assessment for carpal tunnel and upper limb tension - all negative. Dynamic HS stretches x 20 B - standing and swinging leg Manual: Skilled palpation and monitoring of soft tissues during DN.  Trigger Point Dry-Needling  Treatment instructions: Expect mild to moderate muscle soreness. S/S of pneumothorax if dry needled over a lung field, and to seek immediate medical attention should they occur. Patient verbalized understanding of these instructions and education. Patient Consent Given: Yes Education handout provided: previously  provided Muscles treated: B HS and lateral quads, L ADDuctors Electrical stimulation performed: No Parameters: N/A Treatment response/outcome: Twitch Response Elicited and Palpable Increase in Muscle Length  Skilled palpation and monitoring of soft tissues during DN.   03/01/23 Elliptical L2 Incline x 5 min B HS stretch 2x30 B Gastroc stretch at wall Supine serratus push B with blue band x 20 (did some in standing but caused low back tightness Kneeling toes in extension hands on heels 2x 30 sec hold for trunk extension and quad stretch Serratus push up on mat table modified plank position x 10 incline Manual: Skilled palpation and monitoring of soft tissues during DN. Trigger  Point Dry-Needling  Treatment instructions: Expect mild to moderate muscle soreness. S/S of pneumothorax if dry needled over a lung field, and to seek immediate medical attention should they occur. Patient verbalized understanding of these instructions and education. Patient Consent Given: Yes Education handout provided: Yes Muscles treated: B lumbar multifidi (no R L4); R gluteals Electrical stimulation performed: No Parameters: N/A Treatment response/outcome: Twitch Response Elicited and Palpable Increase in Muscle Length      PATIENT EDUCATION:  Education details: HEP update  Person educated: Patient Education method: Explanation, Demonstration, and Handouts Education comprehension: verbalized understanding and returned demonstration  HOME EXERCISE PROGRAM: Access Code: [email or web address] URL: [email or web address] Date: 02/22/2023 Prepared by: Raynelle Fanning  Exercises - Supine ITB Stretch with Strap  - 2 x daily - 7 x weekly - 1 sets - 3 reps - 60 sec hold - Supine Quadriceps Stretch with Strap on Table  - 2 x daily - 7 x weekly - 1 sets - 3 reps - 30-60 sec hold - Seated Hamstring Stretch  - 2 x daily - 7 x weekly - 1 sets - 3 reps - 30-60 sec hold - Sidelying Thoracic Rotation with Open Book  - 1 x daily - 7 x weekly - 2 sets - 5 reps - Sidelying Quadratus Lumborum Stretch on Table  - 1 x daily - 3-4 x weekly - 1 sets - 3 reps - 30-60 hold - Sidelying ITB Stretch off Table (Mirrored)  - 1 x daily - 7 x weekly - 1 sets - 3 reps - 1-5 min hold - QL Stretch Supine (Mirrored)  - 1 x daily - 7 x weekly - 1 sets - 3 reps - 30-60 hold  Patient Education - Trigger Point Dry Needling  ASSESSMENT:  CLINICAL IMPRESSION: The patient reports a 30% improvement overall and no improvement with FOTO outcome score however given the longevity of his issue, a longer period of time may be necessary before significant changes are noted.  He continues to exercise regularly and maintain a high  activity level which helps with long term prognosis.  Electrical stimulation added to indwelling needles for further impacts on pain relief and neuromuscular changes as well as skeletal muscle pump.     OBJECTIVE IMPAIRMENTS: decreased activity tolerance, decreased ROM, decreased strength, hypomobility, increased muscle spasms, impaired flexibility, postural dysfunction, and pain.   ACTIVITY LIMITATIONS: bending, sitting, and squatting  PARTICIPATION LIMITATIONS: cleaning  PERSONAL FACTORS: Age and Time since onset of injury/illness/exacerbation are also affecting patient's functional outcome.   REHAB POTENTIAL: Good  CLINICAL DECISION MAKING: Evolving/moderate complexity  EVALUATION COMPLEXITY: Moderate   GOALS: Goals reviewed with patient? Yes  SHORT TERM GOALS: Target date: 02/22/2023   Patient will be independent with initial HEP.  Baseline:  Goal status: met 10/8 2.  Decreased mid and low back pain by 30% with ADLS  Baseline:  Goal status: met 10/8    LONG TERM GOALS: Target date: 03/22/2023   Patient will be independent with advanced/ongoing HEP to improve outcomes and carryover.  Baseline:  Goal status: INITIAL  2.  Patient will report 75% improvement in low back pain with ADLs and sit to stand transfer to improve QOL.  Baseline:  Goal status: INITIAL  3.  Patient will demonstrate functional pain free ROM to perform ADLs and to check blind spots while biking.   Baseline:  Goal status: INITIAL  4.  Patient will demonstrate improved core strength to 4+/5 to stabilize posture. Baseline:  Goal status: INITIAL  5.  Patient will report 45 on lumbar FOTO to demonstrate improved functional ability.  Baseline: 58 Goal status:ongoing   6.  Patient to demonstrate ability to achieve and maintain good spinal alignment/posturing and body mechanics needed for daily activities. Baseline:  Goal status:met 10/8  8. Patient will demonstrate improved HS and hip flexor  flexibility to allow for neutral spine in standing. Baseline:  Goal status: met 10/8   PLAN:  PT FREQUENCY: 2x/week  PT DURATION: 8 weeks  PLANNED INTERVENTIONS: Therapeutic exercises, Therapeutic activity, Neuromuscular re-education, Balance training, Patient/Family education, Self Care, Joint mobilization, Aquatic Therapy, Dry Needling, Electrical stimulation, Spinal mobilization, Cryotherapy, Moist heat, and Manual therapy.  PLAN FOR NEXT SESSION: ERO next visit with possible decrease in frequency;  Assess response to DN and continue as indicated,assess response to  ES to multifidi;  Focus on flexibility; thoracic mobility, hip flexor/quad and abdominal flexibility, core and postural strengthening.   Lavinia Sharps, PT 03/19/23 9:40 PM Phone: [phone] Fax: [phone]

## 2023-03-20 ENCOUNTER — Encounter: Payer: Self-pay | Admitting: Internal Medicine

## 2023-03-20 ENCOUNTER — Ambulatory Visit (INDEPENDENT_AMBULATORY_CARE_PROVIDER_SITE_OTHER): Payer: Medicare Other | Admitting: Internal Medicine

## 2023-03-20 VITALS — BP 110/70 | HR 68 | Ht 69.0 in | Wt 156.2 lb

## 2023-03-20 DIAGNOSIS — R634 Abnormal weight loss: Secondary | ICD-10-CM | POA: Diagnosis not present

## 2023-03-20 DIAGNOSIS — K638219 Small intestinal bacterial overgrowth, unspecified: Secondary | ICD-10-CM | POA: Diagnosis not present

## 2023-03-20 MED ORDER — RIFAXIMIN 550 MG PO TABS: 550.0000 mg | ORAL_TABLET | Freq: Three times a day (TID) | ORAL | 0 refills | Status: DC

## 2023-03-20 NOTE — Progress Notes (Signed)
Mason Irwin 67 y.o. [birth year] [id number]  Assessment & Plan:   Encounter Diagnoses  Name Primary?   Small intestinal bacterial overgrowth (SIBO) Yes   Loss of weight      Recurrence of symptoms similar to previous episode of SIBO, including diarrhea, gas, and weight loss. Previous treatment with Xifaxan was effective for approximately 6-12 months. -Prescribe Xifaxan again, pending insurance coverage. If not covered, consider alternative antibiotics. -patient to bring in recent lab results for review.  Unintentional Weight Loss Noted 5-pound weight loss over the past 6 months. No early satiety or changes in appetite. Possible association with recurrent SIBO. -Monitor weight closely. -Schedule follow-up appointment in January to reassess weight and response to SIBO treatment.  Diet Patient reports a diet low in added sugars, high in dairy and meat, with daily vegetable intake. -Encourage continuation of balanced diet.  Thyroid Normal thyroid ultrasound and blood tests reported from recent annual exam with Dr. Kateri Plummer. -Review thyroid test results when provided by patient.      Pending the above consider testing for celiac disease though seems unlikely.   Subjective:  Gastroenterology summary:  Chronic diarrhea issues-normal colonoscopy except for hyperplastic rectal polyp but normal biopsies and terminal ileum February 2022.  Mildly positive SIBO test June 2022 -22 ppm increase H2 27 ppm total.  Try diet then took Xifaxan December 2022.  ------------------------------------------------------------------------------- Discussed the use of AI scribe software for clinical note transcription with the patient, who gave verbal consent to proceed.   Chief Complaint: Diarrhea and weight loss  HPI 67 year old Mason Irwin with the gastroenterology problems as outlined above.    The patient, with a history of small intestinal bacterial overgrowth (SIBO), presents with  recurrent symptoms of diarrhea, abdominal pressure, and gas. He describes the bowel movements as fragmented and loose, often occurring during the night. The patient reports a relief period of approximately six months to a year after a course of Xifaxan, suggesting a possible recurrence of SIBO.  The patient also reports a weight loss of five pounds over the last six months, despite maintaining a diet of low sugar, whole milk, yogurt, cheese, meats, and daily vegetables. He denies any changes in appetite or early satiety, but admits to sometimes forcing himself to eat.  The patient underwent a colonoscopy in December 2021, which revealed a rectal hyperplastic polyp and normal biopsies.  Recently, he had blood work done for sports medicine treatments, which were reported as mostly normal. The patient also had a thyroid ultrasound after his annual exam noted a potential thyroid issue, but the ultrasound results were normal.  The patient is currently not on any new medications. He has Medicare but does not have a drug plan, which may affect his ability to afford Xifaxan for the treatment of his recurrent SIBO symptoms.       Wt Readings from Last 3 Encounters:  03/20/23 156 lb 4 oz (70.9 kg)  12/01/21 164 lb 9.6 oz (74.7 kg)  08/09/21 165 lb (74.8 kg)    No Known Allergies Current Meds  Medication Sig   acetaminophen (TYLENOL) 500 MG tablet Take 500 mg by mouth as needed.   acyclovir (ZOVIRAX) 400 MG tablet Take 400 mg by mouth 2 (two) times daily.   cetirizine (ZYRTEC) 10 MG tablet Take 10 mg by mouth daily as needed for allergies.   cromolyn (OPTICROM) 4 % ophthalmic solution Place 1 drop into the left eye 4 (four) times daily.   Multiple Vitamin (MULTIVITAMIN) tablet Take 1 tablet  by mouth daily.   rifaximin (XIFAXAN) 550 MG TABS tablet Take 1 tablet (550 mg total) by mouth 3 (three) times daily.   traMADol (ULTRAM) 50 MG tablet Take 25 mg by mouth as needed.   Past Medical History:   Diagnosis Date   Allergy    Anxiety    Eczema    hands, chronic and recurrent   Family history of prostate cancer in father    FH: early coronary artery disease    Hemorrhoids    Hyperlipidemia    Hyperplastic colon polyp    Pure hypercholesterolemia    S/P appendectomy    S/P bilateral inguinal hernia repair    46 weeks old    Small intestinal bacterial overgrowth (SIBO)    Past Surgical History:  Procedure Laterality Date   APPENDECTOMY     age 56 weeks   COLONOSCOPY     INGUINAL HERNIA REPAIR Bilateral    with appendectomy, age 88 weeks   LUMBAR DISC SURGERY     L4-L5   Social History   Social History Narrative   Married no children   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   Review of Systems As per HPI  Objective:   Physical Exam BP 110/70 (BP Location: Left Arm, Patient Position: Sitting, Cuff Size: Normal)   Pulse 68   Ht 5\' 9"  (1.753 m)   Wt 156 lb 4 oz (70.9 kg)   BMI 23.07 kg/m    Physical Exam   NECK: No lymph nodes in the supraclavicular or cervical areas. No axillary nodes. CARDIOVASCULAR: Heart sounds normal. ABDOMEN: Bowel sounds present. Abdomen soft, non-tender, no hernia.

## 2023-03-20 NOTE — Patient Instructions (Addendum)
VISIT SUMMARY:    During your visit, we discussed your recurrent symptoms of diarrhea, abdominal pressure, and gas, which suggest a possible recurrence of Small Intestinal Bacterial Overgrowth (SIBO). We also noted your unintentional weight loss of five pounds over the last six months. Your diet seems balanced and your recent thyroid ultrasound and blood tests were normal.  YOUR PLAN:  -RECURRENT SMALL INTESTINAL BACTERIAL OVERGROWTH (SIBO): SIBO is a condition where there are too many bacteria in your small intestine. We plan to prescribe Xifaxan again, which previously helped relieve your symptoms. If your insurance doesn't cover this medication, we'll consider alternative antibiotics. Please bring in your recent lab results for review.  -UNINTENTIONAL WEIGHT LOSS: You've lost five pounds over the past six months without trying. This could be related to your SIBO. We'll monitor your weight closely and schedule a follow-up appointment in January to reassess your weight and response to SIBO treatment.  -DIET: You're eating a diet low in added sugars, high in dairy and meat, with daily vegetable intake. This is a balanced diet, and we encourage you to continue it.  -THYROID: Your thyroid, a gland in your neck that produces hormones, appears to be functioning normally based on your recent ultrasound and blood tests. We'll review these test results when you provide them.  INSTRUCTIONS:  Please bring in your recent lab results for review at your next appointment. We'll also schedule a follow-up appointment in January to reassess your weight and response to SIBO treatment.   I appreciate the opportunity to care for you. Stan Head, MD, [facility]

## 2023-03-21 ENCOUNTER — Encounter: Payer: Self-pay | Admitting: Internal Medicine

## 2023-03-22 ENCOUNTER — Ambulatory Visit: Payer: Medicare Other | Admitting: Physical Therapy

## 2023-03-22 DIAGNOSIS — M5459 Other low back pain: Secondary | ICD-10-CM | POA: Diagnosis not present

## 2023-03-22 DIAGNOSIS — R293 Abnormal posture: Secondary | ICD-10-CM

## 2023-03-22 DIAGNOSIS — M546 Pain in thoracic spine: Secondary | ICD-10-CM

## 2023-03-22 NOTE — Therapy (Signed)
OUTPATIENT PHYSICAL THERAPY THORACOLUMBAR TREATMENT/RECERTIFICATION   Patient Name: Mason Irwin MRN: [id number] DOB:1955-06-17, 67 y.o., male Today's Date: 03/22/2023    END OF SESSION:  PT End of Session - 03/22/23 0759     Visit Number 11    Date for PT Re-Evaluation 05/17/23    Authorization Type MCR    Progress Note Due on Visit 20    PT Start Time 0800    PT Stop Time 0842    PT Time Calculation (min) 42 min    Activity Tolerance Patient tolerated treatment well                     Past Medical History:  Diagnosis Date   Allergy    Anxiety    Eczema    hands, chronic and recurrent   Family history of prostate cancer in father    FH: early coronary artery disease    Hemorrhoids    Hyperlipidemia    Hyperplastic colon polyp    Pure hypercholesterolemia    S/P appendectomy    S/P bilateral inguinal hernia repair    20 weeks old    Small intestinal bacterial overgrowth (SIBO)    Past Surgical History:  Procedure Laterality Date   APPENDECTOMY     age 21 weeks   COLONOSCOPY     INGUINAL HERNIA REPAIR Bilateral    with appendectomy, age 24 weeks   LUMBAR DISC SURGERY     L4-L5   Patient Active Problem List   Diagnosis Date Noted   Morton's neuroma, right 04/05/2021   Injury of right suprascapular nerve 03/02/2021   Small intestinal bacterial overgrowth (SIBO) 10/20/2020   Fracture of shaft of clavicle, closed 01/14/2020   Routine general medical examination at a health care facility 09/29/2013   Low back pain 10/04/2011   HSV infection 07/10/2011   DERMATITIS, CONTACT, NOS 03/17/2007    PCP: Farris Has, MD   REFERRING PROVIDER: Dawley, Alan Mulder, DO   REFERRING DIAG: M41.9 (ICD-10-CM) - Scoliosis, unspecified   Rationale for Evaluation and Treatment: Rehabilitation  THERAPY DIAG:  Other low back pain  Pain in thoracic spine  Abnormal posture  ONSET DATE: 2021  SUBJECTIVE:                                                                                                                                                                                            SUBJECTIVE STATEMENT: Going to try Debarah Crape Yoga today. I can't sit on the floor with my legs out straight.  My low back is always a bit testy but it feels better.  The DN  has helped.   Overall 30% improvement.    Has an inversion table at home  Eval: Pain is in midsection of back and down left side wraps around toward front. Also has pain at L4/5 and keeps him from straightening up. Had a fall in 2021 and broke ribs and clavicle. Since then pain has worsened.  He has winging of R scapula (one MD thinks it was nerve damage from the fall). LBP - bending, scrubbing, standing up. Mid back is more with sitting. Has been working on squats and Lawyer. Breathing exercises, hanging. Cycles a lot so HS tight. Some lateral R calf numbness from laminectomy.  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 Yes: NPRS scale:  in right lowback 3/10 Pain location: right low back Pain description: tight, lower back pinch, upper back like I've been whacked Aggravating factors: bending, standing up Relieving factors: meds, massage table, stretching legs  PRECAUTIONS: None  RED FLAGS: None   WEIGHT BEARING RESTRICTIONS: No  FALLS:  Has patient fallen in last 6 months? No  LIVING ENVIRONMENT: Lives with: lives with their spouse Lives in: House/apartment  OCCUPATION: retired, cyclist  PLOF: Independent  PATIENT GOALS: reduce the pain  NEXT MD VISIT: none scheduled  OBJECTIVE:   DIAGNOSTIC FINDINGS:  03/02/20 AP lateral of the thoracic spine shows normal kyphotic curvature without  any evidence of compression fracture.  There are multilevel degenerative  disc changes mild.  He does have significant cervical thoracic lumbar  scoliosis particularly in the  thoracolumbar region where he has a  rightward convex curvature with rotatory component.  He has compensated  curve with fairly straight thoracic spine but begins to curve again going  into the cervical area.   Cervical AP and lateral shows normal lordotic curvature without listhesis.   There is some facet degenerative change in the upper cervical as well as  medical cervical area but nothing really high-grade.  Some uncovertebral  joint hypertrophy at C5-6.  There is mild compensatory scoliosis with a  rightward lean.   PATIENT SURVEYS:  FOTO 58 (61 predicted) 10/8:  51  COGNITION: Overall cognitive status: Within functional limits for tasks assessed     SENSATION: Reports some numbness in R lateral calf since laminectomy  MUSCLE LENGTH: Marked HS, pirformis R>L, ITB, quads, L HF  POSTURE: forward head, posterior pelvic tilt, and R thoracic convexity scoliosis, flexed hips/knees, decreased cervical lordosis  PALPATION: HS insertion B, some tightness in left diaphragm compared to R (likely from scoliosis), Decreased lumbar/thoracic spinal mobility but no pain reported.  LUMBAR ROM:   AROM eval 10/8  Flexion Hands to just below knees* Hands to ankles  Extension 10 flexes knees* 10 degrees  Right lateral flexion 10* 10  Left lateral flexion 10* 10  Right rotation 75%   Left rotation 60%    (Blank rows = not tested)  10/11: TRUNK STRENGTH:  Decreased activation of transverse abdominus muscles; abdominals 4/5; decreased activation of lumbar multifidi; trunk extensors 4/5  LOWER EXTREMITY MMT:     MMT  Right eval Left eval 10/8  Hip flexion 4+ 4+ 5  Hip extension 5 5 5   Hip abduction 5 5 5   Hip adduction 5 5 5   Hip internal rotation     Hip external rotation     Knee flexion 5 5 5   Knee extension 5 5 5  Ankle dorsiflexion 5 5 5    (Blank rows = not tested)  LOWER EXTREMITY ROM:  B Knee extension limited by tight HS, else [facility]    TODAY'S TREATMENT:                                                                                                                               DATE:   10/11: Nu-Step L5 7 min Supine transverse abdominus activation 5x 5 sec hold Supine hip flexion isometric 5x to each side 5 sec hold Supine bent knee lift and lower 5x 5 sec hold (challenging) Quadruped: UE raises 5x, LE raises; alternating UE/LE raise 5x Standard stance Pallof series: press out, press up, press out and up, holding fixed at 90 degrees with march,fixed hold with back step 5x each  2 sets (challenging) Pt demo 1/2 kneel hip flexor stretch dynamic, discussed supine hip flexor stretch static stretch and added to Medbridge 10/4: Nu-Step 10 minutes while discussing status and current symptoms Patient interested in purchasing a foam roll and we discussed uses including vertical placement along length of spine Open books with top leg anchored on foam roll 10x right/left Discussed McKenzie cervical roll for night time Manual: Skilled palpation and monitoring of soft tissues during DN; pelvic distraction Trigger Point Dry-Needling  Treatment instructions: Expect mild to moderate muscle soreness. S/S of pneumothorax if dry needled over a lung field, and to seek immediate medical attention should they occur. Patient verbalized understanding of these instructions and education. Patient Consent Given: Yes Education handout provided: previously provided Muscles treated: sidelying left QL; prone B lumbar multifidi Electrical stimulation performed: No Parameters: N/A Treatment response/outcome: Twitch Response Elicited and Palpable Increase in Muscle Length  03/08/23 Bike L3 x 6 minutes Seated rows 25# 1x10 Standing Rows 20# x 10 Lat pull 45# 2x12 Diagonals B theraband 2 x 10 B Manual: Skilled palpation and monitoring of soft tissues during DN. STM to B UT. Levator and infraspinatus muscles. rigger Point Dry-Needling  Treatment instructions: Expect mild to moderate  muscle soreness. S/S of pneumothorax if dry needled over a lung field, and to seek immediate medical attention should they occur. Patient verbalized understanding of these instructions and education. Patient Consent Given: Yes Education handout provided: previously provided Muscles treated: B UT, R rhomboids, B Infraspinatus Electrical stimulation performed: No Parameters: N/A Treatment response/outcome: Twitch Response Elicited and Palpable Increase in Muscle Length    03/05/23 Long discussion of current symptoms and did quick assessment for carpal tunnel and upper limb tension - all negative. Dynamic HS stretches x 20 B - standing and swinging leg Manual: Skilled palpation and monitoring of soft tissues during DN.  Trigger Point Dry-Needling  Treatment instructions: Expect mild to moderate muscle soreness. S/S of pneumothorax if dry needled over a lung field, and to seek immediate medical attention should they occur. Patient verbalized understanding of these instructions and education. Patient Consent Given: Yes Education handout provided: previously provided Muscles treated: B  HS and lateral quads, L ADDuctors Electrical stimulation performed: No Parameters: N/A Treatment response/outcome: Twitch Response Elicited and Palpable Increase in Muscle Length  Skilled palpation and monitoring of soft tissues during DN.    PATIENT EDUCATION:  Education details: HEP update  Person educated: Patient Education method: Explanation, Demonstration, and Handouts Education comprehension: verbalized understanding and returned demonstration  HOME EXERCISE PROGRAM: No print pt uses app Access Code: [email or web address] URL: [email or web address] Date: 03/22/2023 Prepared by: Lavinia Sharps  Exercises - Supine ITB Stretch with Strap  - 2 x daily - 7 x weekly - 1 sets - 3 reps - 60 sec hold - Supine Quadriceps Stretch with Strap on Table  - 2 x daily - 7 x weekly - 1 sets - 3 reps - 30-60 sec  hold - Seated Hamstring Stretch  - 2 x daily - 7 x weekly - 1 sets - 3 reps - 30-60 sec hold - Sidelying Thoracic Rotation with Open Book  - 1 x daily - 7 x weekly - 2 sets - 5 reps - Sidelying Quadratus Lumborum Stretch on Table  - 1 x daily - 3-4 x weekly - 1 sets - 3 reps - 30-60 hold - Sidelying ITB Stretch off Table (Mirrored)  - 1 x daily - 7 x weekly - 1 sets - 3 reps - 1-5 min hold - QL Stretch Supine (Mirrored)  - 1 x daily - 7 x weekly - 1 sets - 3 reps - 30-60 hold - Full Plank with Scapular Protraction Retraction AROM  - 1 x daily - 3-4 x weekly - 1-2 sets - 10 reps - Supine Transversus Abdominis Bracing - Hands on Stomach  - 1 x daily - 7 x weekly - 1 sets - 5 reps - Hooklying Isometric Hip Flexion  - 1 x daily - 7 x weekly - 1 sets - 5 reps - Bird Dog Progression  - 1 x daily - 7 x weekly - 1 sets - 5 reps - Hip Flexor Stretch at Edge of Bed  - 1 x daily - 7 x weekly - 1 sets - 3 reps - 30 hold Patient Education - Trigger Point Dry Needling  ASSESSMENT:  CLINICAL IMPRESSION: Initiated core stabilization exercises in supine, quadruped and standing.  Verbal and tactile cues to avoid trunk rotation in quadruped and avoid excessive lumbar extension.  He is quite challenged by Pallof movements in standard stance position.  Therapist progressing and updating HEP for increased intensity and challenge level for further strengthening and functional mobility.   The patient would benefit from a continuation of skilled PT for a further progression of strengthening and functional mobility.  Will continue to update and promote independence in a HEP needed for a return to the highest functional level possible with ADLs.       OBJECTIVE IMPAIRMENTS: decreased activity tolerance, decreased ROM, decreased strength, hypomobility, increased muscle spasms, impaired flexibility, postural dysfunction, and pain.   ACTIVITY LIMITATIONS: bending, sitting, and squatting  PARTICIPATION LIMITATIONS:  cleaning  PERSONAL FACTORS: Age and Time since onset of injury/illness/exacerbation are also affecting patient's functional outcome.   REHAB POTENTIAL: Good  CLINICAL DECISION MAKING: Evolving/moderate complexity  EVALUATION COMPLEXITY: Moderate   GOALS: Goals reviewed with patient? Yes  SHORT TERM GOALS: Target date: 02/22/2023   Patient will be independent with initial HEP.  Baseline:  Goal status: met 10/8 2.  Decreased mid and low back pain by 30% with ADLS Baseline:  Goal status: met 10/8    LONG TERM  GOALS: Target date: 05/17/2023   Patient will be independent with advanced/ongoing HEP to improve outcomes and carryover.  Baseline:  Goal status: ongoing  2.  Patient will report 75% improvement in low back pain with ADLs and sit to stand transfer to improve QOL.  Baseline:  Goal status: ongoing  3.  Patient will demonstrate functional pain free ROM to perform ADLs and to check blind spots while biking.   Baseline:  Goal status: ongoing  4.  Patient will demonstrate improved core strength to 4+/5 to stabilize posture. Baseline:  Goal status: ongoing  5.  Patient will report 62 on lumbar FOTO to demonstrate improved functional ability.  Baseline: 58 Goal status:ongoing   6.  Patient to demonstrate ability to achieve and maintain good spinal alignment/posturing and body mechanics needed for daily activities. Baseline:  Goal status:met 10/8  8. Patient will demonstrate improved HS and hip flexor flexibility to allow for neutral spine in standing. Baseline:  Goal status: met 10/8   PLAN:  PT FREQUENCY: 1x/week  PT DURATION: 8 weeks  PLANNED INTERVENTIONS: Therapeutic exercises, Therapeutic activity, Neuromuscular re-education, Balance training, Patient/Family education, Self Care, Joint mobilization, Aquatic Therapy, Dry Needling, Electrical stimulation, Spinal mobilization, Cryotherapy, Moist heat, and Manual therapy.  PLAN FOR NEXT SESSION: try Pallof  again (green band) and add to HEP;  Assess response to DN and continue as indicated,assess response to  ES to multifidi;  Focus on flexibility; thoracic mobility, hip flexor/quad and abdominal flexibility, core and postural strengthening.   Lavinia Sharps, PT 03/22/23 9:46 AM Phone: [phone] Fax: [phone]

## 2023-03-24 ENCOUNTER — Other Ambulatory Visit: Payer: Self-pay | Admitting: Internal Medicine

## 2023-03-24 MED ORDER — METRONIDAZOLE 500 MG PO TABS: 500.0000 mg | ORAL_TABLET | Freq: Three times a day (TID) | ORAL | 0 refills | Status: AC

## 2023-03-28 ENCOUNTER — Ambulatory Visit: Payer: Medicare Other | Admitting: Physical Therapy

## 2023-03-28 DIAGNOSIS — M5459 Other low back pain: Secondary | ICD-10-CM | POA: Diagnosis not present

## 2023-03-28 DIAGNOSIS — R293 Abnormal posture: Secondary | ICD-10-CM

## 2023-03-28 DIAGNOSIS — M546 Pain in thoracic spine: Secondary | ICD-10-CM

## 2023-03-28 NOTE — Therapy (Signed)
OUTPATIENT PHYSICAL THERAPY THORACOLUMBAR TREATMENT   Patient Name: Mason Irwin MRN: [id number] DOB:1956/01/06, 67 y.o., male Today's Date: 03/28/2023    END OF SESSION:  PT End of Session - 03/28/23 0846     Visit Number 12    Date for PT Re-Evaluation 05/17/23    Authorization Type MCR    Progress Note Due on Visit 20    PT Start Time 0847    PT Stop Time 0930    PT Time Calculation (min) 43 min    Activity Tolerance Patient tolerated treatment well                     Past Medical History:  Diagnosis Date   Allergy    Anxiety    Eczema    hands, chronic and recurrent   Family history of prostate cancer in father    FH: early coronary artery disease    Hemorrhoids    Hyperlipidemia    Hyperplastic colon polyp    Pure hypercholesterolemia    S/P appendectomy    S/P bilateral inguinal hernia repair    48 weeks old    Small intestinal bacterial overgrowth (SIBO)    Past Surgical History:  Procedure Laterality Date   APPENDECTOMY     age 66 weeks   COLONOSCOPY     INGUINAL HERNIA REPAIR Bilateral    with appendectomy, age 58 weeks   LUMBAR DISC SURGERY     L4-L5   Patient Active Problem List   Diagnosis Date Noted   Morton's neuroma, right 04/05/2021   Injury of right suprascapular nerve 03/02/2021   Small intestinal bacterial overgrowth (SIBO) 10/20/2020   Fracture of shaft of clavicle, closed 01/14/2020   Routine general medical examination at a health care facility 09/29/2013   Low back pain 10/04/2011   HSV infection 07/10/2011   DERMATITIS, CONTACT, NOS 03/17/2007    PCP: Farris Has, MD   REFERRING PROVIDER: Dawley, Alan Mulder, DO   REFERRING DIAG: M41.9 (ICD-10-CM) - Scoliosis, unspecified   Rationale for Evaluation and Treatment: Rehabilitation  THERAPY DIAG:  Other low back pain  Pain in thoracic spine  Abnormal posture  ONSET DATE: 2021  SUBJECTIVE:                                                                                                                                                                                            SUBJECTIVE STATEMENT: Soreness with  Debarah C[employer] hip openers.  I did better with cleaning the overhead fan,  I could stretch easier and evenly and not as hard as it was tp do this.  Had chiro visit Tuesday and "he cracked the hell out of me."  Has an inversion table at home  Eval: Pain is in midsection of back and down left side wraps around toward front. Also has pain at L4/5 and keeps him from straightening up. Had a fall in 2021 and broke ribs and clavicle. Since then pain has worsened.  He has winging of R scapula (one MD thinks it was nerve damage from the fall). LBP - bending, scrubbing, standing up. Mid back is more with sitting. Has been working on squats and Lawyer. Breathing exercises, hanging. Cycles a lot so HS tight. Some lateral R calf numbness from laminectomy.  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 Yes: NPRS scale:  in right lowback 3/10 Pain location: right low back Pain description: tight, lower back pinch, upper back like I've been whacked Aggravating factors: bending, standing up Relieving factors: meds, massage table, stretching legs  PRECAUTIONS: None  RED FLAGS: None   WEIGHT BEARING RESTRICTIONS: No  FALLS:  Has patient fallen in last 6 months? No  LIVING ENVIRONMENT: Lives with: lives with their spouse Lives in: House/apartment  OCCUPATION: retired, cyclist  PLOF: Independent  PATIENT GOALS: reduce the pain  NEXT MD VISIT: none scheduled  OBJECTIVE:   DIAGNOSTIC FINDINGS:  03/02/20 AP lateral of the thoracic spine shows normal kyphotic curvature without  any evidence of compression fracture.  There are multilevel degenerative  disc changes mild.  He does have significant cervical thoracic lumbar  scoliosis particularly in  the thoracolumbar region where he has a  rightward convex curvature with rotatory component.  He has compensated  curve with fairly straight thoracic spine but begins to curve again going  into the cervical area.   Cervical AP and lateral shows normal lordotic curvature without listhesis.   There is some facet degenerative change in the upper cervical as well as  medical cervical area but nothing really high-grade.  Some uncovertebral  joint hypertrophy at C5-6.  There is mild compensatory scoliosis with a  rightward lean.   PATIENT SURVEYS:  FOTO 58 (61 predicted) 10/8:  51  COGNITION: Overall cognitive status: Within functional limits for tasks assessed     SENSATION: Reports some numbness in R lateral calf since laminectomy  MUSCLE LENGTH: Marked HS, pirformis R>L, ITB, quads, L HF  POSTURE: forward head, posterior pelvic tilt, and R thoracic convexity scoliosis, flexed hips/knees, decreased cervical lordosis  PALPATION: HS insertion B, some tightness in left diaphragm compared to R (likely from scoliosis), Decreased lumbar/thoracic spinal mobility but no pain reported.  LUMBAR ROM:   AROM eval 10/8  Flexion Hands to just below knees* Hands to ankles  Extension 10 flexes knees* 10 degrees  Right lateral flexion 10* 10  Left lateral flexion 10* 10  Right rotation 75%   Left rotation 60%    (Blank rows = not tested)  10/11: TRUNK STRENGTH:  Decreased activation of transverse abdominus muscles; abdominals 4/5; decreased activation of lumbar multifidi; trunk extensors 4/5  LOWER EXTREMITY MMT:     MMT  Right eval Left eval 10/8  Hip flexion 4+ 4+ 5  Hip extension 5 5 5   Hip abduction 5 5 5   Hip adduction 5 5 5   Hip internal rotation     Hip external rotation     Knee flexion 5 5 5   Knee extension 5  5 5  Ankle dorsiflexion 5 5 5    (Blank rows = not tested)  LOWER EXTREMITY ROM:  B Knee extension limited by tight HS, else [facility]    TODAY'S TREATMENT:                                                                                                                               DATE:   10/17: Supine transverse abdominus activation 5x 5 sec hold Supine hip flexion isometric 5x to each side 5 sec hold Supine bent knee lift and lower 5x 5 sec hold (less challenging) Quadruped: UE raises 5x, LE raises; alternating UE/LE raise 5x Standard stance Pallof series with red band: press out, press up, press out and up, holding fixed at 90 degrees with march,fixed hold with back step 5x each  2 sets  Manual: Skilled palpation and monitoring of soft tissues during DN Trigger Point Dry-Needling  Treatment instructions: Expect mild to moderate muscle soreness. S/S of pneumothorax if dry needled over a lung field, and to seek immediate medical attention should they occur. Patient verbalized understanding of these instructions and education. Patient Consent Given: Yes Education handout provided: previously provided Muscles treated:  prone B lumbar multifidi Electrical stimulation performed: No Parameters: N/A Treatment response/outcome: Twitch Response Elicited and Palpable Increase in Muscle Length  10/11: Nu-Step L5 7 min Supine transverse abdominus activation 5x 5 sec hold Supine hip flexion isometric 5x to each side 5 sec hold Supine bent knee lift and lower 5x 5 sec hold (challenging) Quadruped: UE raises 5x, LE raises; alternating UE/LE raise 5x Standard stance Pallof series: press out, press up, press out and up, holding fixed at 90 degrees with march,fixed hold with back step 5x each  2 sets (challenging) Pt demo 1/2 kneel hip flexor stretch dynamic, discussed supine hip flexor stretch static stretch and added to Medbridge 10/4: Nu-Step 10 minutes while discussing status and current symptoms Patient interested in purchasing a foam roll and we discussed uses including vertical placement along length of spine Open books with top leg anchored on foam roll 10x  right/left Discussed McKenzie cervical roll for night time Manual: Skilled palpation and monitoring of soft tissues during DN; pelvic distraction Trigger Point Dry-Needling  Treatment instructions: Expect mild to moderate muscle soreness. S/S of pneumothorax if dry needled over a lung field, and to seek immediate medical attention should they occur. Patient verbalized understanding of these instructions and education. Patient Consent Given: Yes Education handout provided: previously provided Muscles treated: sidelying left QL; prone B lumbar multifidi Electrical stimulation performed: No Parameters: N/A Treatment response/outcome: Twitch Response Elicited and Palpable Increase in Muscle Length  03/08/23 Bike L3 x 6 minutes Seated rows 25# 1x10 Standing Rows 20# x 10 Lat pull 45# 2x12 Diagonals B theraband 2 x 10 B Manual: Skilled palpation and monitoring of soft tissues during DN. STM to B UT. Levator and infraspinatus muscles. rigger Point Dry-Needling  Treatment instructions: Expect mild to  moderate muscle soreness. S/S of pneumothorax if dry needled over a lung field, and to seek immediate medical attention should they occur. Patient verbalized understanding of these instructions and education. Patient Consent Given: Yes Education handout provided: previously provided Muscles treated: B UT, R rhomboids, B Infraspinatus Electrical stimulation performed: No Parameters: N/A Treatment response/outcome: Twitch Response Elicited and Palpable Increase in Muscle Length    PATIENT EDUCATION:  Education details: HEP update  Person educated: Patient Education method: Explanation, Demonstration, and Handouts Education comprehension: verbalized understanding and returned demonstration  HOME EXERCISE PROGRAM: No print pt uses app Access Code: [email or web address] URL: [email or web address] Date: 03/22/2023 Prepared by: Lavinia Sharps  Exercises - Supine ITB Stretch with Strap  - 2 x  daily - 7 x weekly - 1 sets - 3 reps - 60 sec hold - Supine Quadriceps Stretch with Strap on Table  - 2 x daily - 7 x weekly - 1 sets - 3 reps - 30-60 sec hold - Seated Hamstring Stretch  - 2 x daily - 7 x weekly - 1 sets - 3 reps - 30-60 sec hold - Sidelying Thoracic Rotation with Open Book  - 1 x daily - 7 x weekly - 2 sets - 5 reps - Sidelying Quadratus Lumborum Stretch on Table  - 1 x daily - 3-4 x weekly - 1 sets - 3 reps - 30-60 hold - Sidelying ITB Stretch off Table (Mirrored)  - 1 x daily - 7 x weekly - 1 sets - 3 reps - 1-5 min hold - QL Stretch Supine (Mirrored)  - 1 x daily - 7 x weekly - 1 sets - 3 reps - 30-60 hold - Full Plank with Scapular Protraction Retraction AROM  - 1 x daily - 3-4 x weekly - 1-2 sets - 10 reps - Supine Transversus Abdominis Bracing - Hands on Stomach  - 1 x daily - 7 x weekly - 1 sets - 5 reps - Hooklying Isometric Hip Flexion  - 1 x daily - 7 x weekly - 1 sets - 5 reps - Bird Dog Progression  - 1 x daily - 7 x weekly - 1 sets - 5 reps - Hip Flexor Stretch at Edge of Bed  - 1 x daily - 7 x weekly - 1 sets - 3 reps - 30 hold Patient Education - Trigger Point Dry Needling  ASSESSMENT:  CLINICAL IMPRESSION: Improved coordination of breathing particularly with supine abdominal series.  Quadruped LE continue to be challenging with difficulty avoiding pelvic drop.  Over-reliance of superficial paraspinals with standing.  Will continue to work on activation of deep lumbar multifidi.  The patient benefits from dry needling and manual therapy to stimulate underlying myofascial trigger points and muscular tissue for management of neuromusculoskeletal pain and address movement impairments.    OBJECTIVE IMPAIRMENTS: decreased activity tolerance, decreased ROM, decreased strength, hypomobility, increased muscle spasms, impaired flexibility, postural dysfunction, and pain.   ACTIVITY LIMITATIONS: bending, sitting, and squatting  PARTICIPATION LIMITATIONS:  cleaning  PERSONAL FACTORS: Age and Time since onset of injury/illness/exacerbation are also affecting patient's functional outcome.   REHAB POTENTIAL: Good  CLINICAL DECISION MAKING: Evolving/moderate complexity  EVALUATION COMPLEXITY: Moderate   GOALS: Goals reviewed with patient? Yes  SHORT TERM GOALS: Target date: 02/22/2023   Patient will be independent with initial HEP.  Baseline:  Goal status: met 10/8 2.  Decreased mid and low back pain by 30% with ADLS Baseline:  Goal status: met 10/8    LONG TERM GOALS: Target date: 05/17/2023  Patient will be independent with advanced/ongoing HEP to improve outcomes and carryover.  Baseline:  Goal status: ongoing  2.  Patient will report 75% improvement in low back pain with ADLs and sit to stand transfer to improve QOL.  Baseline:  Goal status: ongoing  3.  Patient will demonstrate functional pain free ROM to perform ADLs and to check blind spots while biking.   Baseline:  Goal status: ongoing  4.  Patient will demonstrate improved core strength to 4+/5 to stabilize posture. Baseline:  Goal status: ongoing  5.  Patient will report 51 on lumbar FOTO to demonstrate improved functional ability.  Baseline: 58 Goal status:ongoing   6.  Patient to demonstrate ability to achieve and maintain good spinal alignment/posturing and body mechanics needed for daily activities. Baseline:  Goal status:met 10/8  8. Patient will demonstrate improved HS and hip flexor flexibility to allow for neutral spine in standing. Baseline:  Goal status: met 10/8   PLAN:  PT FREQUENCY: 1x/week  PT DURATION: 8 weeks  PLANNED INTERVENTIONS: Therapeutic exercises, Therapeutic activity, Neuromuscular re-education, Balance training, Patient/Family education, Self Care, Joint mobilization, Aquatic Therapy, Dry Needling, Electrical stimulation, Spinal mobilization, Cryotherapy, Moist heat, and Manual therapy.  PLAN FOR NEXT SESSION:try multifidi  series;  Pallof again (green band)  Assess response to DN and continue as indicated,assess response to  ES to multifidi;  Focus on flexibility; thoracic mobility, hip flexor/quad and abdominal flexibility, core and postural strengthening.   Lavinia Sharps, PT 03/28/23 4:57 PM Phone: [phone] Fax: [phone]

## 2023-04-03 ENCOUNTER — Ambulatory Visit: Payer: Medicare Other | Admitting: Physical Therapy

## 2023-04-03 DIAGNOSIS — R293 Abnormal posture: Secondary | ICD-10-CM

## 2023-04-03 DIAGNOSIS — M5459 Other low back pain: Secondary | ICD-10-CM

## 2023-04-03 DIAGNOSIS — M546 Pain in thoracic spine: Secondary | ICD-10-CM

## 2023-04-03 NOTE — Therapy (Signed)
OUTPATIENT PHYSICAL THERAPY THORACOLUMBAR TREATMENT   Patient Name: Mason Irwin MRN: [id number] DOB:Aug 02, 1955, 67 y.o., male Today's Date: 04/03/2023    END OF SESSION:  PT End of Session - 04/03/23 1154     Visit Number 13    Date for PT Re-Evaluation 05/17/23    Authorization Type MCR    Progress Note Due on Visit 20    PT Start Time 1154    PT Stop Time 1230    PT Time Calculation (min) 36 min    Activity Tolerance Patient tolerated treatment well                     Past Medical History:  Diagnosis Date   Allergy    Anxiety    Eczema    hands, chronic and recurrent   Family history of prostate cancer in father    FH: early coronary artery disease    Hemorrhoids    Hyperlipidemia    Hyperplastic colon polyp    Pure hypercholesterolemia    S/P appendectomy    S/P bilateral inguinal hernia repair    12 weeks old    Small intestinal bacterial overgrowth (SIBO)    Past Surgical History:  Procedure Laterality Date   APPENDECTOMY     age 28 weeks   COLONOSCOPY     INGUINAL HERNIA REPAIR Bilateral    with appendectomy, age 25 weeks   LUMBAR DISC SURGERY     L4-L5   Patient Active Problem List   Diagnosis Date Noted   Morton's neuroma, right 04/05/2021   Injury of right suprascapular nerve 03/02/2021   Small intestinal bacterial overgrowth (SIBO) 10/20/2020   Fracture of shaft of clavicle, closed 01/14/2020   Routine general medical examination at a health care facility 09/29/2013   Low back pain 10/04/2011   HSV infection 07/10/2011   DERMATITIS, CONTACT, NOS 03/17/2007    PCP: Farris Has, MD   REFERRING PROVIDER: Dawley, Alan Mulder, DO   REFERRING DIAG: M41.9 (ICD-10-CM) - Scoliosis, unspecified   Rationale for Evaluation and Treatment: Rehabilitation  THERAPY DIAG:  Other low back pain  Pain in thoracic spine  Abnormal posture  ONSET DATE: 2021  SUBJECTIVE:                                                                                                                                                                                            SUBJECTIVE STATEMENT: My HS are not getting looser both sides.  Maybe I stretch them too much and that makes them tighten up more.   I'd like to try the DN there.  Some  midback tightness.  I should do the foam roll ex's more often.   Has an inversion table at home  Eval: Pain is in midsection of back and down left side wraps around toward front. Also has pain at L4/5 and keeps him from straightening up. Had a fall in 2021 and broke ribs and clavicle. Since then pain has worsened.  He has winging of R scapula (one MD thinks it was nerve damage from the fall). LBP - bending, scrubbing, standing up. Mid back is more with sitting. Has been working on squats and Lawyer. Breathing exercises, hanging. Cycles a lot so HS tight. Some lateral R calf numbness from laminectomy.  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 Yes: NPRS scale:  in right lowback 3/10 Pain location: right low back Pain description: tight, lower back pinch, upper back like I've been whacked Aggravating factors: bending, standing up Relieving factors: meds, massage table, stretching legs  PRECAUTIONS: None  RED FLAGS: None   WEIGHT BEARING RESTRICTIONS: No  FALLS:  Has patient fallen in last 6 months? No  LIVING ENVIRONMENT: Lives with: lives with their spouse Lives in: House/apartment  OCCUPATION: retired, cyclist  PLOF: Independent  PATIENT GOALS: reduce the pain  NEXT MD VISIT: none scheduled  OBJECTIVE:   DIAGNOSTIC FINDINGS:  03/02/20 AP lateral of the thoracic spine shows normal kyphotic curvature without  any evidence of compression fracture.  There are multilevel degenerative  disc changes mild.  He does have significant cervical thoracic lumbar  scoliosis particularly in the  thoracolumbar region where he has a  rightward convex curvature with rotatory component.  He has compensated  curve with fairly straight thoracic spine but begins to curve again going  into the cervical area.   Cervical AP and lateral shows normal lordotic curvature without listhesis.   There is some facet degenerative change in the upper cervical as well as  medical cervical area but nothing really high-grade.  Some uncovertebral  joint hypertrophy at C5-6.  There is mild compensatory scoliosis with a  rightward lean.   PATIENT SURVEYS:  FOTO 58 (61 predicted) 10/8:  51  COGNITION: Overall cognitive status: Within functional limits for tasks assessed     SENSATION: Reports some numbness in R lateral calf since laminectomy  MUSCLE LENGTH: Marked HS, pirformis R>L, ITB, quads, L HF  POSTURE: forward head, posterior pelvic tilt, and R thoracic convexity scoliosis, flexed hips/knees, decreased cervical lordosis  PALPATION: HS insertion B, some tightness in left diaphragm compared to R (likely from scoliosis), Decreased lumbar/thoracic spinal mobility but no pain reported.  LUMBAR ROM:   AROM eval 10/8  Flexion Hands to just below knees* Hands to ankles  Extension 10 flexes knees* 10 degrees  Right lateral flexion 10* 10  Left lateral flexion 10* 10  Right rotation 75%   Left rotation 60%    (Blank rows = not tested)  10/11: TRUNK STRENGTH:  Decreased activation of transverse abdominus muscles; abdominals 4/5; decreased activation of lumbar multifidi; trunk extensors 4/5  LOWER EXTREMITY MMT:     MMT  Right eval Left eval 10/8  Hip flexion 4+ 4+ 5  Hip extension 5 5 5   Hip abduction 5 5 5   Hip adduction 5 5 5   Hip internal rotation     Hip external rotation     Knee flexion 5 5 5   Knee extension  5 5 5   Ankle dorsiflexion 5 5 5    (Blank rows = not tested)  LOWER EXTREMITY ROM:  B Knee extension limited by tight HS, else [facility]    TODAY'S TREATMENT:                                                                                                                               DATE:   10/23: HS dynamic stretch on high step right/left 30 sec each side Seated thoracic extension with ball:  pt feels this may be a bit much but feels deep breathing with this would help with muscle relaxation Discussed supine foam roll thoracic mobility or using a theraball Verbal review of supine, quadruped and standing core ex's Manual: Skilled palpation and monitoring of soft tissues during DN Trigger Point Dry-Needling  Treatment instructions: Expect mild to moderate muscle soreness. S/S of pneumothorax if dry needled over a lung field, and to seek immediate medical attention should they occur. Patient verbalized understanding of these instructions and education. Patient Consent Given: Yes Education handout provided: previously provided Muscles treated:  prone B hamstring Electrical stimulation performed: yes Parameters: 1.5 ma 8 min Treatment response/outcome: Twitch Response Elicited and Palpable Increase in Muscle Length  10/17: Supine transverse abdominus activation 5x 5 sec hold Supine hip flexion isometric 5x to each side 5 sec hold Supine bent knee lift and lower 5x 5 sec hold (less challenging) Quadruped: UE raises 5x, LE raises; alternating UE/LE raise 5x Standard stance Pallof series with red band: press out, press up, press out and up, holding fixed at 90 degrees with march,fixed hold with back step 5x each  2 sets  Manual: Skilled palpation and monitoring of soft tissues during DN Trigger Point Dry-Needling  Treatment instructions: Expect mild to moderate muscle soreness. S/S of pneumothorax if dry needled over a lung field, and to seek immediate medical attention should they occur. Patient verbalized understanding of these instructions and education. Patient Consent Given: Yes Education handout provided: previously provided Muscles treated:  prone B lumbar  multifidi Electrical stimulation performed: No Parameters: N/A Treatment response/outcome: Twitch Response Elicited and Palpable Increase in Muscle Length  10/11: Nu-Step L5 7 min Supine transverse abdominus activation 5x 5 sec hold Supine hip flexion isometric 5x to each side 5 sec hold Supine bent knee lift and lower 5x 5 sec hold (challenging) Quadruped: UE raises 5x, LE raises; alternating UE/LE raise 5x Standard stance Pallof series: press out, press up, press out and up, holding fixed at 90 degrees with march,fixed hold with back step 5x each  2 sets (challenging) Pt demo 1/2 kneel hip flexor stretch dynamic, discussed supine hip flexor stretch static stretch and added to Medbridge 10/4: Nu-Step 10 minutes while discussing status and current symptoms Patient interested in purchasing a foam roll and we discussed uses including vertical placement along length of spine Open books with top leg anchored on foam roll 10x right/left Discussed McKenzie cervical roll for  night time Manual: Skilled palpation and monitoring of soft tissues during DN; pelvic distraction Trigger Point Dry-Needling  Treatment instructions: Expect mild to moderate muscle soreness. S/S of pneumothorax if dry needled over a lung field, and to seek immediate medical attention should they occur. Patient verbalized understanding of these instructions and education. Patient Consent Given: Yes Education handout provided: previously provided Muscles treated: sidelying left QL; prone B lumbar multifidi Electrical stimulation performed: No Parameters: N/A Treatment response/outcome: Twitch Response Elicited and Palpable Increase in Muscle Length  03/08/23 Bike L3 x 6 minutes Seated rows 25# 1x10 Standing Rows 20# x 10 Lat pull 45# 2x12 Diagonals B theraband 2 x 10 B Manual: Skilled palpation and monitoring of soft tissues during DN. STM to B UT. Levator and infraspinatus muscles. rigger Point Dry-Needling  Treatment  instructions: Expect mild to moderate muscle soreness. S/S of pneumothorax if dry needled over a lung field, and to seek immediate medical attention should they occur. Patient verbalized understanding of these instructions and education. Patient Consent Given: Yes Education handout provided: previously provided Muscles treated: B UT, R rhomboids, B Infraspinatus Electrical stimulation performed: No Parameters: N/A Treatment response/outcome: Twitch Response Elicited and Palpable Increase in Muscle Length    PATIENT EDUCATION:  Education details: HEP update  Person educated: Patient Education method: Explanation, Demonstration, and Handouts Education comprehension: verbalized understanding and returned demonstration  HOME EXERCISE PROGRAM: No print pt uses app Access Code: [email or web address] URL: [email or web address] Date: 03/22/2023 Prepared by: Lavinia Sharps  Exercises - Supine ITB Stretch with Strap  - 2 x daily - 7 x weekly - 1 sets - 3 reps - 60 sec hold - Supine Quadriceps Stretch with Strap on Table  - 2 x daily - 7 x weekly - 1 sets - 3 reps - 30-60 sec hold - Seated Hamstring Stretch  - 2 x daily - 7 x weekly - 1 sets - 3 reps - 30-60 sec hold - Sidelying Thoracic Rotation with Open Book  - 1 x daily - 7 x weekly - 2 sets - 5 reps - Sidelying Quadratus Lumborum Stretch on Table  - 1 x daily - 3-4 x weekly - 1 sets - 3 reps - 30-60 hold - Sidelying ITB Stretch off Table (Mirrored)  - 1 x daily - 7 x weekly - 1 sets - 3 reps - 1-5 min hold - QL Stretch Supine (Mirrored)  - 1 x daily - 7 x weekly - 1 sets - 3 reps - 30-60 hold - Full Plank with Scapular Protraction Retraction AROM  - 1 x daily - 3-4 x weekly - 1-2 sets - 10 reps - Supine Transversus Abdominis Bracing - Hands on Stomach  - 1 x daily - 7 x weekly - 1 sets - 5 reps - Hooklying Isometric Hip Flexion  - 1 x daily - 7 x weekly - 1 sets - 5 reps - Bird Dog Progression  - 1 x daily - 7 x weekly - 1 sets - 5  reps - Hip Flexor Stretch at Edge of Bed  - 1 x daily - 7 x weekly - 1 sets - 3 reps - 30 hold Patient Education - Trigger Point Dry Needling  ASSESSMENT:  CLINICAL IMPRESSION: Discussed strategies to affect current limitations including chronic restriction in bilateral HS despite frequent stretching.  He responds well to dynamic lengthening type ex's.  Good initial response to DN combined with ES to bil HS.  Therapist monitoring response to all interventions and modifying treatment accordingly.  OBJECTIVE IMPAIRMENTS: decreased activity tolerance, decreased ROM, decreased strength, hypomobility, increased muscle spasms, impaired flexibility, postural dysfunction, and pain.   ACTIVITY LIMITATIONS: bending, sitting, and squatting  PARTICIPATION LIMITATIONS: cleaning  PERSONAL FACTORS: Age and Time since onset of injury/illness/exacerbation are also affecting patient's functional outcome.   REHAB POTENTIAL: Good  CLINICAL DECISION MAKING: Evolving/moderate complexity  EVALUATION COMPLEXITY: Moderate   GOALS: Goals reviewed with patient? Yes  SHORT TERM GOALS: Target date: 02/22/2023   Patient will be independent with initial HEP.  Baseline:  Goal status: met 10/8 2.  Decreased mid and low back pain by 30% with ADLS Baseline:  Goal status: met 10/8    LONG TERM GOALS: Target date: 05/17/2023   Patient will be independent with advanced/ongoing HEP to improve outcomes and carryover.  Baseline:  Goal status: ongoing  2.  Patient will report 75% improvement in low back pain with ADLs and sit to stand transfer to improve QOL.  Baseline:  Goal status: ongoing  3.  Patient will demonstrate functional pain free ROM to perform ADLs and to check blind spots while biking.   Baseline:  Goal status: ongoing  4.  Patient will demonstrate improved core strength to 4+/5 to stabilize posture. Baseline:  Goal status: ongoing  5.  Patient will report 53 on lumbar FOTO to  demonstrate improved functional ability.  Baseline: 58 Goal status:ongoing   6.  Patient to demonstrate ability to achieve and maintain good spinal alignment/posturing and body mechanics needed for daily activities. Baseline:  Goal status:met 10/8  8. Patient will demonstrate improved HS and hip flexor flexibility to allow for neutral spine in standing. Baseline:  Goal status: met 10/8   PLAN:  PT FREQUENCY: 1x/week  PT DURATION: 8 weeks  PLANNED INTERVENTIONS: Therapeutic exercises, Therapeutic activity, Neuromuscular re-education, Balance training, Patient/Family education, Self Care, Joint mobilization, Aquatic Therapy, Dry Needling, Electrical stimulation, Spinal mobilization, Cryotherapy, Moist heat, and Manual therapy.  PLAN FOR NEXT SESSION:assess response to DN bil HS; try multifidi series or 1/2 kneeling ex's with band;  Pallof again (green band)   Focus on flexibility; thoracic mobility, hip flexor/quad and abdominal flexibility, core and postural strengthening.   Lavinia Sharps, PT 04/03/23 6:37 PM Phone: [phone] Fax: [phone]

## 2023-04-12 ENCOUNTER — Ambulatory Visit: Payer: Medicare Other | Attending: Neurological Surgery | Admitting: Physical Therapy

## 2023-04-12 DIAGNOSIS — M546 Pain in thoracic spine: Secondary | ICD-10-CM | POA: Insufficient documentation

## 2023-04-12 DIAGNOSIS — R293 Abnormal posture: Secondary | ICD-10-CM | POA: Diagnosis present

## 2023-04-12 DIAGNOSIS — M5459 Other low back pain: Secondary | ICD-10-CM | POA: Diagnosis present

## 2023-04-12 NOTE — Therapy (Signed)
OUTPATIENT PHYSICAL THERAPY THORACOLUMBAR TREATMENT   Patient Name: Mason Irwin MRN: [id number] DOB:1955/08/23, 67 y.o., male Today's Date: 04/12/2023    END OF SESSION:  PT End of Session - 04/12/23 0848     Visit Number 14    Date for PT Re-Evaluation 05/17/23    Authorization Type MCR    Progress Note Due on Visit 20    PT Start Time 0847    PT Stop Time 0930    PT Time Calculation (min) 43 min    Activity Tolerance Patient tolerated treatment well                     Past Medical History:  Diagnosis Date   Allergy    Anxiety    Eczema    hands, chronic and recurrent   Family history of prostate cancer in father    FH: early coronary artery disease    Hemorrhoids    Hyperlipidemia    Hyperplastic colon polyp    Pure hypercholesterolemia    S/P appendectomy    S/P bilateral inguinal hernia repair    30 weeks old    Small intestinal bacterial overgrowth (SIBO)    Past Surgical History:  Procedure Laterality Date   APPENDECTOMY     age 47 weeks   COLONOSCOPY     INGUINAL HERNIA REPAIR Bilateral    with appendectomy, age 97 weeks   LUMBAR DISC SURGERY     L4-L5   Patient Active Problem List   Diagnosis Date Noted   Morton's neuroma, right 04/05/2021   Injury of right suprascapular nerve 03/02/2021   Small intestinal bacterial overgrowth (SIBO) 10/20/2020   Fracture of shaft of clavicle, closed 01/14/2020   Routine general medical examination at a health care facility 09/29/2013   Low back pain 10/04/2011   HSV infection 07/10/2011   DERMATITIS, CONTACT, NOS 03/17/2007    PCP: Farris Has, MD   REFERRING PROVIDER: Dawley, Alan Mulder, DO   REFERRING DIAG: M41.9 (ICD-10-CM) - Scoliosis, unspecified   Rationale for Evaluation and Treatment: Rehabilitation  THERAPY DIAG:  Other low back pain  Pain in thoracic spine  ONSET DATE: 2021  SUBJECTIVE:                                                                                                                                                                                            SUBJECTIVE STATEMENT: The DN was awesome and really helped my HS and got rid of that spot in my back too. Went to [employer] yesterday. Has an inversion table at home  Eval: Pain is in midsection of  back and down left side wraps around toward front. Also has pain at L4/5 and keeps him from straightening up. Had a fall in 2021 and broke ribs and clavicle. Since then pain has worsened.  He has winging of R scapula (one MD thinks it was nerve damage from the fall). LBP - bending, scrubbing, standing up. Mid back is more with sitting. Has been working on squats and Lawyer. Breathing exercises, hanging. Cycles a lot so HS tight. Some lateral R calf numbness from laminectomy.  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 Yes: NPRS scale:  in right lowback 3/10 Pain location: right low back Pain description: tight, lower back pinch, upper back like I've been whacked Aggravating factors: bending, standing up Relieving factors: meds, massage table, stretching legs  PRECAUTIONS: None  RED FLAGS: None   WEIGHT BEARING RESTRICTIONS: No  FALLS:  Has patient fallen in last 6 months? No  LIVING ENVIRONMENT: Lives with: lives with their spouse Lives in: House/apartment  OCCUPATION: retired, cyclist  PLOF: Independent  PATIENT GOALS: reduce the pain  NEXT MD VISIT: none scheduled  OBJECTIVE:   DIAGNOSTIC FINDINGS:  03/02/20 AP lateral of the thoracic spine shows normal kyphotic curvature without  any evidence of compression fracture.  There are multilevel degenerative  disc changes mild.  He does have significant cervical thoracic lumbar  scoliosis particularly in the thoracolumbar region where he has a  rightward convex curvature with rotatory component.  He has compensated  curve with fairly  straight thoracic spine but begins to curve again going  into the cervical area.   Cervical AP and lateral shows normal lordotic curvature without listhesis.   There is some facet degenerative change in the upper cervical as well as  medical cervical area but nothing really high-grade.  Some uncovertebral  joint hypertrophy at C5-6.  There is mild compensatory scoliosis with a  rightward lean.   PATIENT SURVEYS:  FOTO 58 (61 predicted) 10/8:  51  COGNITION: Overall cognitive status: Within functional limits for tasks assessed     SENSATION: Reports some numbness in R lateral calf since laminectomy  MUSCLE LENGTH: Marked HS, pirformis R>L, ITB, quads, L HF  POSTURE: forward head, posterior pelvic tilt, and R thoracic convexity scoliosis, flexed hips/knees, decreased cervical lordosis  PALPATION: HS insertion B, some tightness in left diaphragm compared to R (likely from scoliosis), Decreased lumbar/thoracic spinal mobility but no pain reported.  LUMBAR ROM:   AROM eval 10/8  Flexion Hands to just below knees* Hands to ankles  Extension 10 flexes knees* 10 degrees  Right lateral flexion 10* 10  Left lateral flexion 10* 10  Right rotation 75%   Left rotation 60%    (Blank rows = not tested)  10/11: TRUNK STRENGTH:  Decreased activation of transverse abdominus muscles; abdominals 4/5; decreased activation of lumbar multifidi; trunk extensors 4/5 11/1: Improved activation of transverse abdominus muscles; abdominals 4+/5; decreased activation of lumbar multifidi; trunk extensors 4/5  LOWER EXTREMITY MMT:     MMT  Right eval Left eval 10/8  Hip flexion 4+ 4+ 5  Hip extension 5 5 5   Hip abduction 5 5 5   Hip adduction 5 5 5   Hip internal rotation     Hip external rotation     Knee flexion 5 5 5   Knee extension 5 5 5   Ankle dorsiflexion 5 5 5    (  Blank rows = not tested)  LOWER EXTREMITY ROM:  B Knee extension limited by tight HS, else [facility]    TODAY'S TREATMENT:                                                                                                                               DATE:   11/1: Supine abdominal brace, hand to knee isometric, bent knee raise and lower 5x each Standing: green band Pallof press out 10x, stir the pot, ABCs holding at 90 degrees with marching, holding at 90 degrees  with back step Single arm shoulder extension with same side and then opp side hip flexion 10x to each side Manual: Skilled palpation and monitoring of soft tissues during DN Trigger Point Dry-Needling  Treatment instructions: Expect mild to moderate muscle soreness. S/S of pneumothorax if dry needled over a lung field, and to seek immediate medical attention should they occur. Patient verbalized understanding of these instructions and education. Patient Consent Given: Yes Education handout provided: previously provided Muscles treated:  prone B hamstring Electrical stimulation performed: yes Parameters: 1.5 ma 8 min Treatment response/outcome: Twitch Response Elicited and Palpable Increase in Muscle Length  Heat 2 min bil HS 10/23: HS dynamic stretch on high step right/left 30 sec each side Seated thoracic extension with ball:  pt feels this may be a bit much but feels deep breathing with this would help with muscle relaxation Discussed supine foam roll thoracic mobility or using a theraball Verbal review of supine, quadruped and standing core ex's Manual: Skilled palpation and monitoring of soft tissues during DN Trigger Point Dry-Needling  Treatment instructions: Expect mild to moderate muscle soreness. S/S of pneumothorax if dry needled over a lung field, and to seek immediate medical attention should they occur. Patient verbalized understanding of these instructions and education. Patient Consent Given: Yes Education handout provided: previously provided Muscles treated:  prone B hamstring Electrical stimulation performed: yes Parameters: 1.5 ma 8  min Treatment response/outcome: Twitch Response Elicited and Palpable Increase in Muscle Length  10/17: Supine transverse abdominus activation 5x 5 sec hold Supine hip flexion isometric 5x to each side 5 sec hold Supine bent knee lift and lower 5x 5 sec hold (less challenging) Quadruped: UE raises 5x, LE raises; alternating UE/LE raise 5x Standard stance Pallof series with red band: press out, press up, press out and up, holding fixed at 90 degrees with march,fixed hold with back step 5x each  2 sets  Manual: Skilled palpation and monitoring of soft tissues during DN Trigger Point Dry-Needling  Treatment instructions: Expect mild to moderate muscle soreness. S/S of pneumothorax if dry needled over a lung field, and to seek immediate medical attention should they occur. Patient verbalized understanding of these instructions and education. Patient Consent Given: Yes Education handout provided: previously provided Muscles treated:  prone B lumbar multifidi Electrical stimulation performed: No Parameters: N/A Treatment response/outcome: Twitch Response Elicited and Palpable Increase in Muscle Length  10/11:  Nu-Step L5 7 min Supine transverse abdominus activation 5x 5 sec hold Supine hip flexion isometric 5x to each side 5 sec hold Supine bent knee lift and lower 5x 5 sec hold (challenging) Quadruped: UE raises 5x, LE raises; alternating UE/LE raise 5x Standard stance Pallof series: press out, press up, press out and up, holding fixed at 90 degrees with march,fixed hold with back step 5x each  2 sets (challenging) Pt demo 1/2 kneel hip flexor stretch dynamic, discussed supine hip flexor stretch static stretch and added to [employer] 10/4: Nu-Step 10 minutes while discussing status and current symptoms Patient interested in purchasing a foam roll and we discussed uses including vertical placement along length of spine Open books with top leg anchored on foam roll 10x right/left Discussed  McKenzie cervical roll for night time Manual: Skilled palpation and monitoring of soft tissues during DN; pelvic distraction Trigger Point Dry-Needling  Treatment instructions: Expect mild to moderate muscle soreness. S/S of pneumothorax if dry needled over a lung field, and to seek immediate medical attention should they occur. Patient verbalized understanding of these instructions and education. Patient Consent Given: Yes Education handout provided: previously provided Muscles treated: sidelying left QL; prone B lumbar multifidi Electrical stimulation performed: No Parameters: N/A Treatment response/outcome: Twitch Response Elicited and Palpable Increase in Muscle Length    PATIENT EDUCATION:  Education details: HEP update  Person educated: Patient Education method: Explanation, Demonstration, and Handouts Education comprehension: verbalized understanding and returned demonstration  HOME EXERCISE PROGRAM: No print pt uses app Access Code: [email or web address] URL: [email or web address] Date: 04/12/2023 Prepared by: Lavinia Sharps  Exercises - Supine ITB Stretch with Strap  - 2 x daily - 7 x weekly - 1 sets - 3 reps - 60 sec hold - Supine Quadriceps Stretch with Strap on Table  - 2 x daily - 7 x weekly - 1 sets - 3 reps - 30-60 sec hold - Seated Hamstring Stretch  - 2 x daily - 7 x weekly - 1 sets - 3 reps - 30-60 sec hold - Sidelying Thoracic Rotation with Open Book  - 1 x daily - 7 x weekly - 2 sets - 5 reps - Sidelying Quadratus Lumborum Stretch on Table  - 1 x daily - 3-4 x weekly - 1 sets - 3 reps - 30-60 hold - Sidelying ITB Stretch off Table (Mirrored)  - 1 x daily - 7 x weekly - 1 sets - 3 reps - 1-5 min hold - QL Stretch Supine (Mirrored)  - 1 x daily - 7 x weekly - 1 sets - 3 reps - 30-60 hold - Full Plank with Scapular Protraction Retraction AROM  - 1 x daily - 3-4 x weekly - 1-2 sets - 10 reps - Supine Transversus Abdominis Bracing - Hands on Stomach  - 1 x daily - 7  x weekly - 1 sets - 5 reps - Hooklying Isometric Hip Flexion  - 1 x daily - 7 x weekly - 1 sets - 5 reps - Supine March  - 1 x daily - 7 x weekly - 1 sets - 10 reps - Bird Dog Progression  - 1 x daily - 7 x weekly - 1 sets - 5 reps - Hip Flexor Stretch at Edge of Bed  - 1 x daily - 7 x weekly - 1 sets - 3 reps - 30 hold - Standing Anti-Rotation Press with Anchored Resistance  - 1 x daily - 7 x weekly - 1 sets - 10 reps - Stir  the Pot  - 1 x daily - 7 x weekly - 1 sets - 10 reps - Resistance Pulldown with March  - 1 x daily - 7 x weekly - 1 sets - 10 reps Patient Education - Trigger Point Dry Needling  ASSESSMENT:  CLINICAL IMPRESSION: Improved transverse abdominus muscle activation in both supine and standing positions.  Therapist monitoring response throughout treatment session and providing verbal cues to optimize technique for exercise effectiveness.  HEP updated to include addition core strengthening in supine and standing.   The patient benefits significantly from dry needling combined with ES to stimulate HS muscular tissue for management of neuromusculoskeletal pain and address movement impairments.   OBJECTIVE IMPAIRMENTS: decreased activity tolerance, decreased ROM, decreased strength, hypomobility, increased muscle spasms, impaired flexibility, postural dysfunction, and pain.   ACTIVITY LIMITATIONS: bending, sitting, and squatting  PARTICIPATION LIMITATIONS: cleaning  PERSONAL FACTORS: Age and Time since onset of injury/illness/exacerbation are also affecting patient's functional outcome.   REHAB POTENTIAL: Good  CLINICAL DECISION MAKING: Evolving/moderate complexity  EVALUATION COMPLEXITY: Moderate   GOALS: Goals reviewed with patient? Yes  SHORT TERM GOALS: Target date: 02/22/2023   Patient will be independent with initial HEP.  Baseline:  Goal status: met 10/8 2.  Decreased mid and low back pain by 30% with ADLS Baseline:  Goal status: met 10/8    LONG TERM  GOALS: Target date: 05/17/2023   Patient will be independent with advanced/ongoing HEP to improve outcomes and carryover.  Baseline:  Goal status: ongoing  2.  Patient will report 75% improvement in low back pain with ADLs and sit to stand transfer to improve QOL.  Baseline:  Goal status: ongoing  3.  Patient will demonstrate functional pain free ROM to perform ADLs and to check blind spots while biking.   Baseline:  Goal status: ongoing  4.  Patient will demonstrate improved core strength to 4+/5 to stabilize posture. Baseline:  Goal status: ongoing  5.  Patient will report 79 on lumbar FOTO to demonstrate improved functional ability.  Baseline: 58 Goal status:ongoing   6.  Patient to demonstrate ability to achieve and maintain good spinal alignment/posturing and body mechanics needed for daily activities. Baseline:  Goal status:met 10/8  8. Patient will demonstrate improved HS and hip flexor flexibility to allow for neutral spine in standing. Baseline:  Goal status: met 10/8   PLAN:  PT FREQUENCY: 1x/week  PT DURATION: 8 weeks  PLANNED INTERVENTIONS: Therapeutic exercises, Therapeutic activity, Neuromuscular re-education, Balance training, Patient/Family education, Self Care, Joint mobilization, Aquatic Therapy, Dry Needling, Electrical stimulation, Spinal mobilization, Cryotherapy, Moist heat, and Manual therapy.  PLAN FOR NEXT SESSION: DN bil HS;  multifidi series or 1/2 kneeling ex's with band;  Pallof again (green band)   Focus on flexibility; thoracic mobility, hip flexor/quad and abdominal flexibility, core and postural strengthening.   Lavinia Sharps, PT 04/12/23 10:59 AM Phone: [phone] Fax: [phone]

## 2023-04-17 ENCOUNTER — Encounter: Payer: Self-pay | Admitting: Physical Medicine and Rehabilitation

## 2023-04-17 ENCOUNTER — Ambulatory Visit: Payer: Medicare Other | Admitting: Physical Medicine and Rehabilitation

## 2023-04-17 VITALS — BP 115/74 | HR 60

## 2023-04-17 DIAGNOSIS — M545 Low back pain, unspecified: Secondary | ICD-10-CM

## 2023-04-17 DIAGNOSIS — G8929 Other chronic pain: Secondary | ICD-10-CM | POA: Diagnosis not present

## 2023-04-17 DIAGNOSIS — M546 Pain in thoracic spine: Secondary | ICD-10-CM | POA: Diagnosis not present

## 2023-04-17 DIAGNOSIS — M7918 Myalgia, other site: Secondary | ICD-10-CM

## 2023-04-17 MED ORDER — DIAZEPAM 5 MG PO TABS: ORAL_TABLET | ORAL | 0 refills | Status: DC

## 2023-04-17 NOTE — Progress Notes (Signed)
Functional Pain Scale - descriptive words and definitions  Distracting (5)    Aware of pain/able to complete some ADL's but limited by pain/sleep is affected and active distractions are only slightly useful. Moderate range order  Average Pain 5  Lower and mid back pain, does radiate occasionally to right leg. Had a fall that fx some ribs a couple years ago. He states he always has pain. Bending over and standing up straight are difficult.
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
## 2023-04-24 ENCOUNTER — Ambulatory Visit: Payer: Medicare Other | Admitting: Physical Therapy

## 2023-04-24 DIAGNOSIS — R293 Abnormal posture: Secondary | ICD-10-CM

## 2023-04-24 DIAGNOSIS — M546 Pain in thoracic spine: Secondary | ICD-10-CM

## 2023-04-24 DIAGNOSIS — M5459 Other low back pain: Secondary | ICD-10-CM

## 2023-04-24 NOTE — Therapy (Signed)
OUTPATIENT PHYSICAL THERAPY THORACOLUMBAR TREATMENT   Patient Name: Mason Irwin MRN: [id number] DOB:1956-04-07, 67 y.o., male Today's Date: 04/24/2023    END OF SESSION:  PT End of Session - 04/24/23 1148     Visit Number 15    Date for PT Re-Evaluation 05/17/23    Authorization Type MCR    Progress Note Due on Visit 20    PT Start Time 1148    PT Stop Time 1230    PT Time Calculation (min) 42 min    Activity Tolerance Patient tolerated treatment well                     Past Medical History:  Diagnosis Date   Allergy    Anxiety    Eczema    hands, chronic and recurrent   Family history of prostate cancer in father    FH: early coronary artery disease    Hemorrhoids    Hyperlipidemia    Hyperplastic colon polyp    Pure hypercholesterolemia    S/P appendectomy    S/P bilateral inguinal hernia repair    41 weeks old    Small intestinal bacterial overgrowth (SIBO)    Past Surgical History:  Procedure Laterality Date   APPENDECTOMY     age 37 weeks   COLONOSCOPY     INGUINAL HERNIA REPAIR Bilateral    with appendectomy, age 23 weeks   LUMBAR DISC SURGERY     L4-L5   Patient Active Problem List   Diagnosis Date Noted   Morton's neuroma, right 04/05/2021   Injury of right suprascapular nerve 03/02/2021   Small intestinal bacterial overgrowth (SIBO) 10/20/2020   Fracture of shaft of clavicle, closed 01/14/2020   Routine general medical examination at a health care facility 09/29/2013   Low back pain 10/04/2011   HSV infection 07/10/2011   DERMATITIS, CONTACT, NOS 03/17/2007    PCP: Farris Has, MD   REFERRING PROVIDER: Dawley, Alan Mulder, DO   REFERRING DIAG: M41.9 (ICD-10-CM) - Scoliosis, unspecified   Rationale for Evaluation and Treatment: Rehabilitation  THERAPY DIAG:  Other low back pain  Pain in thoracic spine  Abnormal posture  ONSET DATE: 2021  SUBJECTIVE:                                                                                                                                                                                            SUBJECTIVE STATEMENT: My low back has been better since we've been working on the HS.  I went for a bike ride after the last session so not overly sore. Gets chiro once a week.  My hips feel more aligned now more since been doing all this.   Pain is less than when I came here.  Has an inversion table at home  Eval: Pain is in midsection of back and down left side wraps around toward front. Also has pain at L4/5 and keeps him from straightening up. Had a fall in 2021 and broke ribs and clavicle. Since then pain has worsened.  He has winging of R scapula (one MD thinks it was nerve damage from the fall). LBP - bending, scrubbing, standing up. Mid back is more with sitting. Has been working on squats and Lawyer. Breathing exercises, hanging. Cycles a lot so HS tight. Some lateral R calf numbness from laminectomy.  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 Yes: NPRS scale:  in right lowback 3/10 Pain location: right low back Pain description: tight, lower back pinch, upper back like I've been whacked Aggravating factors: bending, standing up Relieving factors: meds, massage table, stretching legs  PRECAUTIONS: None  RED FLAGS: None   WEIGHT BEARING RESTRICTIONS: No  FALLS:  Has patient fallen in last 6 months? No  LIVING ENVIRONMENT: Lives with: lives with their spouse Lives in: House/apartment  OCCUPATION: retired, cyclist  PLOF: Independent  PATIENT GOALS: reduce the pain  NEXT MD VISIT: none scheduled  OBJECTIVE:   DIAGNOSTIC FINDINGS:  03/02/20 AP lateral of the thoracic spine shows normal kyphotic curvature without  any evidence of compression fracture.  There are multilevel degenerative  disc changes mild.  He does have significant cervical thoracic  lumbar  scoliosis particularly in the thoracolumbar region where he has a  rightward convex curvature with rotatory component.  He has compensated  curve with fairly straight thoracic spine but begins to curve again going  into the cervical area.   Cervical AP and lateral shows normal lordotic curvature without listhesis.   There is some facet degenerative change in the upper cervical as well as  medical cervical area but nothing really high-grade.  Some uncovertebral  joint hypertrophy at C5-6.  There is mild compensatory scoliosis with a  rightward lean.   PATIENT SURVEYS:  FOTO 58 (61 predicted) 10/8:  51  COGNITION: Overall cognitive status: Within functional limits for tasks assessed     SENSATION: Reports some numbness in R lateral calf since laminectomy  MUSCLE LENGTH: Marked HS, pirformis R>L, ITB, quads, L HF  POSTURE: forward head, posterior pelvic tilt, and R thoracic convexity scoliosis, flexed hips/knees, decreased cervical lordosis  PALPATION: HS insertion B, some tightness in left diaphragm compared to R (likely from scoliosis), Decreased lumbar/thoracic spinal mobility but no pain reported.  LUMBAR ROM:   AROM eval 10/8  Flexion Hands to just below knees* Hands to ankles  Extension 10 flexes knees* 10 degrees  Right lateral flexion 10* 10  Left lateral flexion 10* 10  Right rotation 75%   Left rotation 60%    (Blank rows = not tested)  10/11: TRUNK STRENGTH:  Decreased activation of transverse abdominus muscles; abdominals 4/5; decreased activation of lumbar multifidi; trunk extensors 4/5 11/1: Improved activation of transverse abdominus muscles; abdominals 4+/5; decreased activation of lumbar multifidi; trunk extensors 4/5  LOWER EXTREMITY MMT:     MMT  Right eval Left eval 10/8  Hip flexion 4+ 4+ 5  Hip extension 5 5 5   Hip abduction 5 5 5   Hip adduction  5 5 5   Hip internal rotation     Hip external rotation     Knee flexion 5 5 5   Knee  extension 5 5 5   Ankle dorsiflexion 5 5 5    (Blank rows = not tested)  LOWER EXTREMITY ROM:  B Knee extension limited by tight HS, else [facility]    TODAY'S TREATMENT:                                                                                                                              DATE:   11/13: Review of current HEP and response to treatment  Prone multifidi over 2 pillows: pelvic press down 5 sec hold 5x, pelvic press with HS curl 5x right/left, pelvic press with whole leg lift 5x right/left, pelvic press with bent knee hip extension 5x right/left Manual: Skilled palpation and monitoring of soft tissues during DN Trigger Point Dry-Needling  Treatment instructions: Expect mild to moderate muscle soreness. S/S of pneumothorax if dry needled over a lung field, and to seek immediate medical attention should they occur. Patient verbalized understanding of these instructions and education. Patient Consent Given: Yes Education handout provided: previously provided Muscles treated:  prone B hamstring Electrical stimulation performed: yes Parameters: 1.5 ma 8 min Treatment response/outcome: Twitch Response Elicited and Palpable Increase in Muscle Length   11/1: Supine abdominal brace, hand to knee isometric, bent knee raise and lower 5x each Standing: green band Pallof press out 10x, stir the pot, ABCs holding at 90 degrees with marching, holding at 90 degrees  with back step Single arm shoulder extension with same side and then opp side hip flexion 10x to each side Manual: Skilled palpation and monitoring of soft tissues during DN Trigger Point Dry-Needling  Treatment instructions: Expect mild to moderate muscle soreness. S/S of pneumothorax if dry needled over a lung field, and to seek immediate medical attention should they occur. Patient verbalized understanding of these instructions and education. Patient Consent Given: Yes Education handout provided: previously provided Muscles  treated:  prone B hamstring Electrical stimulation performed: yes Parameters: 1.5 ma 8 min Treatment response/outcome: Twitch Response Elicited and Palpable Increase in Muscle Length  Heat 2 min bil HS 10/23: HS dynamic stretch on high step right/left 30 sec each side Seated thoracic extension with ball:  pt feels this may be a bit much but feels deep breathing with this would help with muscle relaxation Discussed supine foam roll thoracic mobility or using a theraball Verbal review of supine, quadruped and standing core ex's Manual: Skilled palpation and monitoring of soft tissues during DN Trigger Point Dry-Needling  Treatment instructions: Expect mild to moderate muscle soreness. S/S of pneumothorax if dry needled over a lung field, and to seek immediate medical attention should they occur. Patient verbalized understanding of these instructions and education. Patient Consent Given: Yes Education handout provided: previously provided Muscles treated:  prone B hamstring Electrical stimulation performed: yes Parameters: 1.5 ma 8 min Treatment  response/outcome: Twitch Response Elicited and Palpable Increase in Muscle Length  10/17: Supine transverse abdominus activation 5x 5 sec hold Supine hip flexion isometric 5x to each side 5 sec hold Supine bent knee lift and lower 5x 5 sec hold (less challenging) Quadruped: UE raises 5x, LE raises; alternating UE/LE raise 5x Standard stance Pallof series with red band: press out, press up, press out and up, holding fixed at 90 degrees with march,fixed hold with back step 5x each  2 sets  Manual: Skilled palpation and monitoring of soft tissues during DN Trigger Point Dry-Needling  Treatment instructions: Expect mild to moderate muscle soreness. S/S of pneumothorax if dry needled over a lung field, and to seek immediate medical attention should they occur. Patient verbalized understanding of these instructions and education. Patient Consent Given:  Yes Education handout provided: previously provided Muscles treated:  prone B lumbar multifidi Electrical stimulation performed: No Parameters: N/A Treatment response/outcome: Twitch Response Elicited and Palpable Increase in Muscle Length  10/11: Nu-Step L5 7 min Supine transverse abdominus activation 5x 5 sec hold Supine hip flexion isometric 5x to each side 5 sec hold Supine bent knee lift and lower 5x 5 sec hold (challenging) Quadruped: UE raises 5x, LE raises; alternating UE/LE raise 5x Standard stance Pallof series: press out, press up, press out and up, holding fixed at 90 degrees with march,fixed hold with back step 5x each  2 sets (challenging) Pt demo 1/2 kneel hip flexor stretch dynamic, discussed supine hip flexor stretch static stretch and added to [employer]   PATIENT EDUCATION:  Education details: HEP update  Person educated: Patient Education method: Programmer, multimedia, Facilities manager, and Handouts Education comprehension: verbalized understanding and returned demonstration  HOME EXERCISE PROGRAM: No print pt uses app Access Code: [email or web address] URL: [email or web address] Date: 04/12/2023 Prepared by: Lavinia Sharps  Exercises - Supine ITB Stretch with Strap  - 2 x daily - 7 x weekly - 1 sets - 3 reps - 60 sec hold - Supine Quadriceps Stretch with Strap on Table  - 2 x daily - 7 x weekly - 1 sets - 3 reps - 30-60 sec hold - Seated Hamstring Stretch  - 2 x daily - 7 x weekly - 1 sets - 3 reps - 30-60 sec hold - Sidelying Thoracic Rotation with Open Book  - 1 x daily - 7 x weekly - 2 sets - 5 reps - Sidelying Quadratus Lumborum Stretch on Table  - 1 x daily - 3-4 x weekly - 1 sets - 3 reps - 30-60 hold - Sidelying ITB Stretch off Table (Mirrored)  - 1 x daily - 7 x weekly - 1 sets - 3 reps - 1-5 min hold - QL Stretch Supine (Mirrored)  - 1 x daily - 7 x weekly - 1 sets - 3 reps - 30-60 hold - Full Plank with Scapular Protraction Retraction AROM  - 1 x daily - 3-4 x  weekly - 1-2 sets - 10 reps - Supine Transversus Abdominis Bracing - Hands on Stomach  - 1 x daily - 7 x weekly - 1 sets - 5 reps - Hooklying Isometric Hip Flexion  - 1 x daily - 7 x weekly - 1 sets - 5 reps - Supine March  - 1 x daily - 7 x weekly - 1 sets - 10 reps - Bird Dog Progression  - 1 x daily - 7 x weekly - 1 sets - 5 reps - Hip Flexor Stretch at Edge of Bed  - 1 x daily -  7 x weekly - 1 sets - 3 reps - 30 hold - Standing Anti-Rotation Press with Anchored Resistance  - 1 x daily - 7 x weekly - 1 sets - 10 reps - Stir the Pot  - 1 x daily - 7 x weekly - 1 sets - 10 reps - Resistance Pulldown with March  - 1 x daily - 7 x weekly - 1 sets - 10 reps Patient Education - Trigger Point Dry Needling  ASSESSMENT:  CLINICAL IMPRESSION: The patient reports some fearfulness of lumbar multifidi activation ex's but has a good initial response without exacerbation of symptoms.  Tactile cues to activate lumbar extensors and verbal cues to decrease compensatory gluteal contraction.  He responds well to DN combined with ES to HS which has positively impacted LBP.    OBJECTIVE IMPAIRMENTS: decreased activity tolerance, decreased ROM, decreased strength, hypomobility, increased muscle spasms, impaired flexibility, postural dysfunction, and pain.   ACTIVITY LIMITATIONS: bending, sitting, and squatting  PARTICIPATION LIMITATIONS: cleaning  PERSONAL FACTORS: Age and Time since onset of injury/illness/exacerbation are also affecting patient's functional outcome.   REHAB POTENTIAL: Good  CLINICAL DECISION MAKING: Evolving/moderate complexity  EVALUATION COMPLEXITY: Moderate   GOALS: Goals reviewed with patient? Yes  SHORT TERM GOALS: Target date: 02/22/2023   Patient will be independent with initial HEP.  Baseline:  Goal status: met 10/8 2.  Decreased mid and low back pain by 30% with ADLS Baseline:  Goal status: met 10/8    LONG TERM GOALS: Target date: 05/17/2023   Patient will be  independent with advanced/ongoing HEP to improve outcomes and carryover.  Baseline:  Goal status: ongoing  2.  Patient will report 75% improvement in low back pain with ADLs and sit to stand transfer to improve QOL.  Baseline:  Goal status: ongoing  3.  Patient will demonstrate functional pain free ROM to perform ADLs and to check blind spots while biking.   Baseline:  Goal status: ongoing  4.  Patient will demonstrate improved core strength to 4+/5 to stabilize posture. Baseline:  Goal status: ongoing  5.  Patient will report 65 on lumbar FOTO to demonstrate improved functional ability.  Baseline: 58 Goal status:ongoing   6.  Patient to demonstrate ability to achieve and maintain good spinal alignment/posturing and body mechanics needed for daily activities. Baseline:  Goal status:met 10/8  8. Patient will demonstrate improved HS and hip flexor flexibility to allow for neutral spine in standing. Baseline:  Goal status: met 10/8   PLAN:  PT FREQUENCY: 1x/week  PT DURATION: 8 weeks  PLANNED INTERVENTIONS: Therapeutic exercises, Therapeutic activity, Neuromuscular re-education, Balance training, Patient/Family education, Self Care, Joint mobilization, Aquatic Therapy, Dry Needling, Electrical stimulation, Spinal mobilization, Cryotherapy, Moist heat, and Manual therapy.  PLAN FOR NEXT SESSION: DN bil HS; assess response to multifidi series over 2 pillows;  Pallof again (green band)   Focus on flexibility; thoracic mobility, hip flexor/quad and abdominal flexibility, core and postural strengthening. Getting MRI in 2 weeks   Lavinia Sharps, PT 04/24/23 4:54 PM Phone: [phone] Fax: [phone]

## 2023-05-01 ENCOUNTER — Ambulatory Visit: Payer: Medicare Other | Admitting: Physical Therapy

## 2023-05-01 DIAGNOSIS — R293 Abnormal posture: Secondary | ICD-10-CM

## 2023-05-01 DIAGNOSIS — M5459 Other low back pain: Secondary | ICD-10-CM

## 2023-05-01 DIAGNOSIS — M546 Pain in thoracic spine: Secondary | ICD-10-CM

## 2023-05-01 NOTE — Therapy (Signed)
OUTPATIENT PHYSICAL THERAPY THORACOLUMBAR TREATMENT   Patient Name: Mason Irwin MRN: [id number] DOB:05-22-1956, 67 y.o., male Today's Date: 05/01/2023    END OF SESSION:  PT End of Session - 05/01/23 1147     Visit Number 16    Date for PT Re-Evaluation 05/17/23    Authorization Type MCR    Progress Note Due on Visit 20    PT Start Time 1148    PT Stop Time 1230    PT Time Calculation (min) 42 min    Activity Tolerance Patient tolerated treatment well                     Past Medical History:  Diagnosis Date   Allergy    Anxiety    Eczema    hands, chronic and recurrent   Family history of prostate cancer in father    FH: early coronary artery disease    Hemorrhoids    Hyperlipidemia    Hyperplastic colon polyp    Pure hypercholesterolemia    S/P appendectomy    S/P bilateral inguinal hernia repair    27 weeks old    Small intestinal bacterial overgrowth (SIBO)    Past Surgical History:  Procedure Laterality Date   APPENDECTOMY     age 84 weeks   COLONOSCOPY     INGUINAL HERNIA REPAIR Bilateral    with appendectomy, age 53 weeks   LUMBAR DISC SURGERY     L4-L5   Patient Active Problem List   Diagnosis Date Noted   Morton's neuroma, right 04/05/2021   Injury of right suprascapular nerve 03/02/2021   Small intestinal bacterial overgrowth (SIBO) 10/20/2020   Fracture of shaft of clavicle, closed 01/14/2020   Routine general medical examination at a health care facility 09/29/2013   Low back pain 10/04/2011   HSV infection 07/10/2011   DERMATITIS, CONTACT, NOS 03/17/2007    PCP: Farris Has, MD   REFERRING PROVIDER: Dawley, Alan Mulder, DO   REFERRING DIAG: M41.9 (ICD-10-CM) - Scoliosis, unspecified   Rationale for Evaluation and Treatment: Rehabilitation  THERAPY DIAG:  Other low back pain  Pain in thoracic spine  Abnormal posture  ONSET DATE: 2021  SUBJECTIVE:                                                                                                                                                                                            SUBJECTIVE STATEMENT: I'm having an issue with some of the ex's with keeping my back flat with ex's. Biking 30 miles 18 mph.   Has an inversion table at home  Eval: Pain is  in midsection of back and down left side wraps around toward front. Also has pain at L4/5 and keeps him from straightening up. Had a fall in 2021 and broke ribs and clavicle. Since then pain has worsened.  He has winging of R scapula (one MD thinks it was nerve damage from the fall). LBP - bending, scrubbing, standing up. Mid back is more with sitting. Has been working on squats and Lawyer. Breathing exercises, hanging. Cycles a lot so HS tight. Some lateral R calf numbness from laminectomy.  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 Yes: NPRS scale:  in right lowback 3/10 Pain location: right low back Pain description: tight, lower back pinch, upper back like I've been whacked Aggravating factors: bending, standing up Relieving factors: meds, massage table, stretching legs  PRECAUTIONS: None  RED FLAGS: None   WEIGHT BEARING RESTRICTIONS: No  FALLS:  Has patient fallen in last 6 months? No  LIVING ENVIRONMENT: Lives with: lives with their spouse Lives in: House/apartment  OCCUPATION: retired, cyclist  PLOF: Independent  PATIENT GOALS: reduce the pain  NEXT MD VISIT: none scheduled  OBJECTIVE:   DIAGNOSTIC FINDINGS:  03/02/20 AP lateral of the thoracic spine shows normal kyphotic curvature without  any evidence of compression fracture.  There are multilevel degenerative  disc changes mild.  He does have significant cervical thoracic lumbar  scoliosis particularly in the thoracolumbar region where he has a  rightward convex curvature with rotatory component.  He has compensated   curve with fairly straight thoracic spine but begins to curve again going  into the cervical area.   Cervical AP and lateral shows normal lordotic curvature without listhesis.   There is some facet degenerative change in the upper cervical as well as  medical cervical area but nothing really high-grade.  Some uncovertebral  joint hypertrophy at C5-6.  There is mild compensatory scoliosis with a  rightward lean.   PATIENT SURVEYS:  FOTO 58 (61 predicted) 10/8:  51  COGNITION: Overall cognitive status: Within functional limits for tasks assessed     SENSATION: Reports some numbness in R lateral calf since laminectomy  MUSCLE LENGTH: Marked HS, pirformis R>L, ITB, quads, L HF  POSTURE: forward head, posterior pelvic tilt, and R thoracic convexity scoliosis, flexed hips/knees, decreased cervical lordosis  PALPATION: HS insertion B, some tightness in left diaphragm compared to R (likely from scoliosis), Decreased lumbar/thoracic spinal mobility but no pain reported.  LUMBAR ROM:   AROM eval 10/8  Flexion Hands to just below knees* Hands to ankles  Extension 10 flexes knees* 10 degrees  Right lateral flexion 10* 10  Left lateral flexion 10* 10  Right rotation 75%   Left rotation 60%    (Blank rows = not tested)  10/11: TRUNK STRENGTH:  Decreased activation of transverse abdominus muscles; abdominals 4/5; decreased activation of lumbar multifidi; trunk extensors 4/5 11/1: Improved activation of transverse abdominus muscles; abdominals 4+/5; decreased activation of lumbar multifidi; trunk extensors 4/5  LOWER EXTREMITY MMT:     MMT  Right eval Left eval 10/8  Hip flexion 4+ 4+ 5  Hip extension 5 5 5   Hip abduction 5 5 5   Hip adduction 5 5 5   Hip internal rotation     Hip external rotation     Knee flexion 5 5 5   Knee extension 5 5 5   Ankle dorsiflexion 5  5 5   (Blank rows = not tested)  LOWER EXTREMITY ROM:  B Knee extension limited by tight HS, else  [facility]    TODAY'S TREATMENT:                                                                                                                              DATE:   11/20: Review of current HEP and modifications to technique: ITB sidelying stretch left  Open books right using foam roll 10x (can use other hand to stabilize knee) Pallof using wider stance rather narrow stance (less pain) Prone multifidi over 2 pillows: pelvic press down 5 sec hold 5x, pelvic press with HS curl 5x right/left, pelvic press with whole leg lift 5x right/left, pelvic press with bent knee hip extension 5x right/left (painful with bent knee hip extension)  Marjo Bicker pose Manual: Skilled palpation and monitoring of soft tissues during DN Trigger Point Dry-Needling  Treatment instructions: Expect mild to moderate muscle soreness. S/S of pneumothorax if dry needled over a lung field, and to seek immediate medical attention should they occur. Patient verbalized understanding of these instructions and education. Patient Consent Given: Yes Education handout provided: previously provided Muscles treated:  prone B hamstring (left medial > right) Electrical stimulation performed: yes Parameters: 1.5 ma 8 min Treatment response/outcome: Twitch Response Elicited and Palpable Increase in Muscle Length  11/13: Review of current HEP and response to treatment  Prone multifidi over 2 pillows: pelvic press down 5 sec hold 5x, pelvic press with HS curl 5x right/left, pelvic press with whole leg lift 5x right/left, pelvic press with bent knee hip extension 5x right/left Manual: Skilled palpation and monitoring of soft tissues during DN Trigger Point Dry-Needling  Treatment instructions: Expect mild to moderate muscle soreness. S/S of pneumothorax if dry needled over a lung field, and to seek immediate medical attention should they occur. Patient verbalized understanding of these instructions and education. Patient Consent Given:  Yes Education handout provided: previously provided Muscles treated:  prone B hamstring Electrical stimulation performed: yes Parameters: 1.5 ma 8 min Treatment response/outcome: Twitch Response Elicited and Palpable Increase in Muscle Length   11/1: Supine abdominal brace, hand to knee isometric, bent knee raise and lower 5x each Standing: green band Pallof press out 10x, stir the pot, ABCs holding at 90 degrees with marching, holding at 90 degrees  with back step Single arm shoulder extension with same side and then opp side hip flexion 10x to each side Manual: Skilled palpation and monitoring of soft tissues during DN Trigger Point Dry-Needling  Treatment instructions: Expect mild to moderate muscle soreness. S/S of pneumothorax if dry needled over a lung field, and to seek immediate medical attention should they occur. Patient verbalized understanding of these instructions and education. Patient Consent Given: Yes Education handout provided: previously provided Muscles treated:  prone B hamstring Electrical stimulation performed: yes Parameters: 1.5 ma 8 min Treatment response/outcome: Twitch Response Elicited and Palpable Increase in  Muscle Length  Heat 2 min bil HS 10/23: HS dynamic stretch on high step right/left 30 sec each side Seated thoracic extension with ball:  pt feels this may be a bit much but feels deep breathing with this would help with muscle relaxation Discussed supine foam roll thoracic mobility or using a theraball Verbal review of supine, quadruped and standing core ex's Manual: Skilled palpation and monitoring of soft tissues during DN Trigger Point Dry-Needling  Treatment instructions: Expect mild to moderate muscle soreness. S/S of pneumothorax if dry needled over a lung field, and to seek immediate medical attention should they occur. Patient verbalized understanding of these instructions and education. Patient Consent Given: Yes Education handout provided:  previously provided Muscles treated:  prone B hamstring Electrical stimulation performed: yes Parameters: 1.5 ma 8 min Treatment response/outcome: Twitch Response Elicited and Palpable Increase in Muscle Length  10/17: Supine transverse abdominus activation 5x 5 sec hold Supine hip flexion isometric 5x to each side 5 sec hold Supine bent knee lift and lower 5x 5 sec hold (less challenging) Quadruped: UE raises 5x, LE raises; alternating UE/LE raise 5x Standard stance Pallof series with red band: press out, press up, press out and up, holding fixed at 90 degrees with march,fixed hold with back step 5x each  2 sets  Manual: Skilled palpation and monitoring of soft tissues during DN Trigger Point Dry-Needling  Treatment instructions: Expect mild to moderate muscle soreness. S/S of pneumothorax if dry needled over a lung field, and to seek immediate medical attention should they occur. Patient verbalized understanding of these instructions and education. Patient Consent Given: Yes Education handout provided: previously provided Muscles treated:  prone B lumbar multifidi Electrical stimulation performed: No Parameters: N/A Treatment response/outcome: Twitch Response Elicited and Palpable Increase in Muscle Length   PATIENT EDUCATION:  Education details: HEP update  Person educated: Patient Education method: Explanation, Demonstration, and Handouts Education comprehension: verbalized understanding and returned demonstration  HOME EXERCISE PROGRAM: No print pt uses app Access Code: [email or web address] URL: [email or web address] Date: 04/12/2023 Prepared by: Lavinia Sharps  Exercises - Supine ITB Stretch with Strap  - 2 x daily - 7 x weekly - 1 sets - 3 reps - 60 sec hold - Supine Quadriceps Stretch with Strap on Table  - 2 x daily - 7 x weekly - 1 sets - 3 reps - 30-60 sec hold - Seated Hamstring Stretch  - 2 x daily - 7 x weekly - 1 sets - 3 reps - 30-60 sec hold - Sidelying  Thoracic Rotation with Open Book  - 1 x daily - 7 x weekly - 2 sets - 5 reps - Sidelying Quadratus Lumborum Stretch on Table  - 1 x daily - 3-4 x weekly - 1 sets - 3 reps - 30-60 hold - Sidelying ITB Stretch off Table (Mirrored)  - 1 x daily - 7 x weekly - 1 sets - 3 reps - 1-5 min hold - QL Stretch Supine (Mirrored)  - 1 x daily - 7 x weekly - 1 sets - 3 reps - 30-60 hold - Full Plank with Scapular Protraction Retraction AROM  - 1 x daily - 3-4 x weekly - 1-2 sets - 10 reps - Supine Transversus Abdominis Bracing - Hands on Stomach  - 1 x daily - 7 x weekly - 1 sets - 5 reps - Hooklying Isometric Hip Flexion  - 1 x daily - 7 x weekly - 1 sets - 5 reps - Supine March  - 1 x  daily - 7 x weekly - 1 sets - 10 reps - Bird Dog Progression  - 1 x daily - 7 x weekly - 1 sets - 5 reps - Hip Flexor Stretch at Edge of Bed  - 1 x daily - 7 x weekly - 1 sets - 3 reps - 30 hold - Standing Anti-Rotation Press with Anchored Resistance  - 1 x daily - 7 x weekly - 1 sets - 10 reps - Stir the Pot  - 1 x daily - 7 x weekly - 1 sets - 10 reps - Resistance Pulldown with March  - 1 x daily - 7 x weekly - 1 sets - 10 reps Patient Education - Trigger Point Dry Needling  ASSESSMENT:  CLINICAL IMPRESSION: Exercise technique modifications discussed to alleviate discomfort including wider stance and abdominal engagement. He demonstrates excellent compliance with home exercise adherence.   He continues to reports benefit with DN combined with ES to HS which positively affects low back pain and improves muscle length.    OBJECTIVE IMPAIRMENTS: decreased activity tolerance, decreased ROM, decreased strength, hypomobility, increased muscle spasms, impaired flexibility, postural dysfunction, and pain.   ACTIVITY LIMITATIONS: bending, sitting, and squatting  PARTICIPATION LIMITATIONS: cleaning  PERSONAL FACTORS: Age and Time since onset of injury/illness/exacerbation are also affecting patient's functional outcome.    REHAB POTENTIAL: Good  CLINICAL DECISION MAKING: Evolving/moderate complexity  EVALUATION COMPLEXITY: Moderate   GOALS: Goals reviewed with patient? Yes  SHORT TERM GOALS: Target date: 02/22/2023   Patient will be independent with initial HEP.  Baseline:  Goal status: met 10/8 2.  Decreased mid and low back pain by 30% with ADLS Baseline:  Goal status: met 10/8    LONG TERM GOALS: Target date: 05/17/2023   Patient will be independent with advanced/ongoing HEP to improve outcomes and carryover.  Baseline:  Goal status: ongoing  2.  Patient will report 75% improvement in low back pain with ADLs and sit to stand transfer to improve QOL.  Baseline:  Goal status: ongoing  3.  Patient will demonstrate functional pain free ROM to perform ADLs and to check blind spots while biking.   Baseline:  Goal status: ongoing  4.  Patient will demonstrate improved core strength to 4+/5 to stabilize posture. Baseline:  Goal status: ongoing  5.  Patient will report 73 on lumbar FOTO to demonstrate improved functional ability.  Baseline: 58 Goal status:ongoing   6.  Patient to demonstrate ability to achieve and maintain good spinal alignment/posturing and body mechanics needed for daily activities. Baseline:  Goal status:met 10/8  8. Patient will demonstrate improved HS and hip flexor flexibility to allow for neutral spine in standing. Baseline:  Goal status: met 10/8   PLAN:  PT FREQUENCY: 1x/week  PT DURATION: 8 weeks  PLANNED INTERVENTIONS: Therapeutic exercises, Therapeutic activity, Neuromuscular re-education, Balance training, Patient/Family education, Self Care, Joint mobilization, Aquatic Therapy, Dry Needling, Electrical stimulation, Spinal mobilization, Cryotherapy, Moist heat, and Manual therapy.  PLAN FOR NEXT SESSION: KX;  DN/ES bil HS; assess response to multifidi series over 2 pillows;  Pallof  (green band)   Focus on flexibility; thoracic mobility, hip  flexor/quad and abdominal flexibility, core and postural strengthening. Getting MRI in 2 weeks   Lavinia Sharps, PT 05/01/23 2:22 PM Phone: [phone] Fax: [phone]

## 2023-05-06 ENCOUNTER — Ambulatory Visit
Admission: RE | Admit: 2023-05-06 | Discharge: 2023-05-06 | Disposition: A | Discharge status: Home / Self Care | Payer: Medicare Other | Source: Ambulatory Visit | Attending: Physical Medicine and Rehabilitation | Admitting: Physical Medicine and Rehabilitation

## 2023-05-06 DIAGNOSIS — M545 Low back pain, unspecified: Secondary | ICD-10-CM

## 2023-05-06 DIAGNOSIS — G8929 Other chronic pain: Secondary | ICD-10-CM

## 2023-05-07 ENCOUNTER — Ambulatory Visit: Payer: Medicare Other | Admitting: Physical Therapy

## 2023-05-07 DIAGNOSIS — R293 Abnormal posture: Secondary | ICD-10-CM

## 2023-05-07 DIAGNOSIS — M5459 Other low back pain: Secondary | ICD-10-CM | POA: Diagnosis not present

## 2023-05-07 DIAGNOSIS — M546 Pain in thoracic spine: Secondary | ICD-10-CM

## 2023-05-07 NOTE — Therapy (Signed)
OUTPATIENT PHYSICAL THERAPY THORACOLUMBAR TREATMENT/DISCHARGE SUMMARY   Patient Name: Mason Irwin MRN: [id number] DOB:17-Oct-1955, 67 y.o., male Today's Date: 05/07/2023    END OF SESSION:  PT End of Session - 05/07/23 1614     Visit Number 17    Date for PT Re-Evaluation 05/17/23    Authorization Type MCR    Progress Note Due on Visit 20    PT Start Time 1615    PT Stop Time 1655    PT Time Calculation (min) 40 min    Activity Tolerance Patient tolerated treatment well                     Past Medical History:  Diagnosis Date   Allergy    Anxiety    Eczema    hands, chronic and recurrent   Family history of prostate cancer in father    FH: early coronary artery disease    Hemorrhoids    Hyperlipidemia    Hyperplastic colon polyp    Pure hypercholesterolemia    S/P appendectomy    S/P bilateral inguinal hernia repair    80 weeks old    Small intestinal bacterial overgrowth (SIBO)    Past Surgical History:  Procedure Laterality Date   APPENDECTOMY     age 32 weeks   COLONOSCOPY     INGUINAL HERNIA REPAIR Bilateral    with appendectomy, age 57 weeks   LUMBAR DISC SURGERY     L4-L5   Patient Active Problem List   Diagnosis Date Noted   Morton's neuroma, right 04/05/2021   Injury of right suprascapular nerve 03/02/2021   Small intestinal bacterial overgrowth (SIBO) 10/20/2020   Fracture of shaft of clavicle, closed 01/14/2020   Routine general medical examination at a health care facility 09/29/2013   Low back pain 10/04/2011   HSV infection 07/10/2011   DERMATITIS, CONTACT, NOS 03/17/2007    PCP: Farris Has, MD   REFERRING PROVIDER: Dawley, Alan Mulder, DO   REFERRING DIAG: M41.9 (ICD-10-CM) - Scoliosis, unspecified   Rationale for Evaluation and Treatment: Rehabilitation  THERAPY DIAG:  Other low back pain  Pain in thoracic spine  Abnormal posture  ONSET DATE: 2021  SUBJECTIVE:                                                                                                                                                                                            SUBJECTIVE STATEMENT: I had an MRI yesterday. Up late working on internet issue at home. My back is a little cranky. I think the DN is helping. I still feel tight, I need to  stretch every day I guess.  My back is better than when I came here 35% better.  Less pain with standing. I avoid shopping/walking,  Walking uphill is OK.  I'm not as sore cycling.  Lowered air pressure on bike and that helps.    Has an inversion table at home  Eval: Pain is in midsection of back and down left side wraps around toward front. Also has pain at L4/5 and keeps him from straightening up. Had a fall in 2021 and broke ribs and clavicle. Since then pain has worsened.  He has winging of R scapula (one MD thinks it was nerve damage from the fall). LBP - bending, scrubbing, standing up. Mid back is more with sitting. Has been working on squats and Lawyer. Breathing exercises, hanging. Cycles a lot so HS tight. Some lateral R calf numbness from laminectomy.  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 Yes: NPRS scale:  bil lowback 3/10 Pain location: hip flexor left cramping; low back Pain description: tight, lower back pinch, upper back like I've been whacked Aggravating factors: bending, standing up Relieving factors: meds, massage table, stretching legs  PRECAUTIONS: None  RED FLAGS: None   WEIGHT BEARING RESTRICTIONS: No  FALLS:  Has patient fallen in last 6 months? No  LIVING ENVIRONMENT: Lives with: lives with their spouse Lives in: House/apartment  OCCUPATION: retired, cyclist  PLOF: Independent  PATIENT GOALS: reduce the pain  NEXT MD VISIT: none scheduled  OBJECTIVE:   DIAGNOSTIC FINDINGS:  03/02/20 AP lateral of the thoracic spine shows normal  kyphotic curvature without  any evidence of compression fracture.  There are multilevel degenerative  disc changes mild.  He does have significant cervical thoracic lumbar  scoliosis particularly in the thoracolumbar region where he has a  rightward convex curvature with rotatory component.  He has compensated  curve with fairly straight thoracic spine but begins to curve again going  into the cervical area.   Cervical AP and lateral shows normal lordotic curvature without listhesis.   There is some facet degenerative change in the upper cervical as well as  medical cervical area but nothing really high-grade.  Some uncovertebral  joint hypertrophy at C5-6.  There is mild compensatory scoliosis with a  rightward lean.   PATIENT SURVEYS:  FOTO 58 (61 predicted) 10/8:  51 11/26: 59%  COGNITION: Overall cognitive status: Within functional limits for tasks assessed     SENSATION: Reports some numbness in R lateral calf since laminectomy  MUSCLE LENGTH: Marked HS, pirformis R>L, ITB, quads, L HF  POSTURE: forward head, posterior pelvic tilt, and R thoracic convexity scoliosis, flexed hips/knees, decreased cervical lordosis  PALPATION: HS insertion B, some tightness in left diaphragm compared to R (likely from scoliosis), Decreased lumbar/thoracic spinal mobility but no pain reported.  LUMBAR ROM:   AROM eval 10/8 11/26  Flexion Hands to just below knees* Hands to ankles Fingertips to ankle  Extension 10 flexes knees* 10 degrees 15  Right lateral flexion 10* 10 15  Left lateral flexion 10* 10 15  Right rotation 75%    Left rotation 60%     (Blank rows = not tested)  10/11: TRUNK STRENGTH:  Decreased activation of transverse abdominus muscles; abdominals 4/5; decreased activation of lumbar multifidi; trunk extensors 4/5 11/1: Improved activation of transverse abdominus muscles; abdominals 4+/5; decreased activation of lumbar multifidi; trunk  extensors 4/5 11/26: Grossly 4+/5 to  5-/5  LOWER EXTREMITY MMT:     MMT  Right eval Left eval 10/8  Hip flexion 4+ 4+ 5  Hip extension 5 5 5   Hip abduction 5 5 5   Hip adduction 5 5 5   Hip internal rotation     Hip external rotation     Knee flexion 5 5 5   Knee extension 5 5 5   Ankle dorsiflexion 5 5 5    (Blank rows = not tested)  LOWER EXTREMITY ROM:  B Knee extension limited by tight HS, else [facility]    TODAY'S TREATMENT:                                                                                                                              DATE:   11/26: Review of current HEP  Bird dog Supine abdominal draw in with bent knee lowering  Lumbar ROM Discussion of HEP and benefits of both stretching and strengthening (for tightness) Manual: Skilled palpation and monitoring of soft tissues during DN Trigger Point Dry-Needling  Treatment instructions: Expect mild to moderate muscle soreness. S/S of pneumothorax if dry needled over a lung field, and to seek immediate medical attention should they occur. Patient verbalized understanding of these instructions and education. Patient Consent Given: Yes Education handout provided: previously provided Muscles treated:  prone B hamstring  Electrical stimulation performed: yes bil lumbar multifidi Parameters: 1.5 ma 8 min Treatment response/outcome: Twitch Response Elicited and Palpable Increase in Muscle Length  11/20: Review of current HEP and modifications to technique: ITB sidelying stretch left  Open books right using foam roll 10x (can use other hand to stabilize knee) Pallof using wider stance rather narrow stance (less pain) Prone multifidi over 2 pillows: pelvic press down 5 sec hold 5x, pelvic press with HS curl 5x right/left, pelvic press with whole leg lift 5x right/left, pelvic press with bent knee hip extension 5x right/left (painful with bent knee hip extension)  Marjo Bicker pose Manual: Skilled palpation and monitoring of soft tissues during DN Trigger Point  Dry-Needling  Treatment instructions: Expect mild to moderate muscle soreness. S/S of pneumothorax if dry needled over a lung field, and to seek immediate medical attention should they occur. Patient verbalized understanding of these instructions and education. Patient Consent Given: Yes Education handout provided: previously provided Muscles treated:  prone B hamstring (left medial > right) Electrical stimulation performed: yes Parameters: 1.5 ma 8 min Treatment response/outcome: Twitch Response Elicited and Palpable Increase in Muscle Length  11/13: Review of current HEP and response to treatment  Prone multifidi over 2 pillows: pelvic press down 5 sec hold 5x, pelvic press with HS curl 5x right/left, pelvic press with whole leg lift 5x right/left, pelvic press with bent knee hip extension 5x right/left Manual: Skilled palpation and monitoring of soft tissues during DN Trigger Point Dry-Needling  Treatment instructions: Expect mild to moderate muscle soreness. S/S of pneumothorax if dry needled over  a lung field, and to seek immediate medical attention should they occur. Patient verbalized understanding of these instructions and education. Patient Consent Given: Yes Education handout provided: previously provided Muscles treated:  prone B hamstring Electrical stimulation performed: yes Parameters: 1.5 ma 8 min Treatment response/outcome: Twitch Response Elicited and Palpable Increase in Muscle Length   11/1: Supine abdominal brace, hand to knee isometric, bent knee raise and lower 5x each Standing: green band Pallof press out 10x, stir the pot, ABCs holding at 90 degrees with marching, holding at 90 degrees  with back step Single arm shoulder extension with same side and then opp side hip flexion 10x to each side Manual: Skilled palpation and monitoring of soft tissues during DN Trigger Point Dry-Needling  Treatment instructions: Expect mild to moderate muscle soreness. S/S of  pneumothorax if dry needled over a lung field, and to seek immediate medical attention should they occur. Patient verbalized understanding of these instructions and education. Patient Consent Given: Yes Education handout provided: previously provided Muscles treated:  prone B hamstring Electrical stimulation performed: yes Parameters: 1.5 ma 8 min Treatment response/outcome: Twitch Response Elicited and Palpable Increase in Muscle Length  Heat 2 min bil HS  PATIENT EDUCATION:  Education details: HEP update  Person educated: Patient Education method: Explanation, Demonstration, and Handouts Education comprehension: verbalized understanding and returned demonstration  HOME EXERCISE PROGRAM: No print pt uses app Access Code: [email or web address] URL: [email or web address] Date: 04/12/2023 Prepared by: Lavinia Sharps  Exercises - Supine ITB Stretch with Strap  - 2 x daily - 7 x weekly - 1 sets - 3 reps - 60 sec hold - Supine Quadriceps Stretch with Strap on Table  - 2 x daily - 7 x weekly - 1 sets - 3 reps - 30-60 sec hold - Seated Hamstring Stretch  - 2 x daily - 7 x weekly - 1 sets - 3 reps - 30-60 sec hold - Sidelying Thoracic Rotation with Open Book  - 1 x daily - 7 x weekly - 2 sets - 5 reps - Sidelying Quadratus Lumborum Stretch on Table  - 1 x daily - 3-4 x weekly - 1 sets - 3 reps - 30-60 hold - Sidelying ITB Stretch off Table (Mirrored)  - 1 x daily - 7 x weekly - 1 sets - 3 reps - 1-5 min hold - QL Stretch Supine (Mirrored)  - 1 x daily - 7 x weekly - 1 sets - 3 reps - 30-60 hold - Full Plank with Scapular Protraction Retraction AROM  - 1 x daily - 3-4 x weekly - 1-2 sets - 10 reps - Supine Transversus Abdominis Bracing - Hands on Stomach  - 1 x daily - 7 x weekly - 1 sets - 5 reps - Hooklying Isometric Hip Flexion  - 1 x daily - 7 x weekly - 1 sets - 5 reps - Supine March  - 1 x daily - 7 x weekly - 1 sets - 10 reps - Bird Dog Progression  - 1 x daily - 7 x weekly - 1 sets  - 5 reps - Hip Flexor Stretch at Edge of Bed  - 1 x daily - 7 x weekly - 1 sets - 3 reps - 30 hold - Standing Anti-Rotation Press with Anchored Resistance  - 1 x daily - 7 x weekly - 1 sets - 10 reps - Stir the Pot  - 1 x daily - 7 x weekly - 1 sets - 10 reps - Resistance Pulldown with March  -  1 x daily - 7 x weekly - 1 sets - 10 reps Patient Education - Trigger Point Dry Needling  ASSESSMENT:  CLINICAL IMPRESSION: The patient has met the majority of rehab goals, with noted improvements in pain reduction, outcome score, ROM, strength and functional mobility.  A comprehensive HEP has been established and anticipate further improvements over time with regular performance of the program.  Recommend discharge from PT at this time.  OBJECTIVE IMPAIRMENTS: decreased activity tolerance, decreased ROM, decreased strength, hypomobility, increased muscle spasms, impaired flexibility, postural dysfunction, and pain.   ACTIVITY LIMITATIONS: bending, sitting, and squatting  PARTICIPATION LIMITATIONS: cleaning  PERSONAL FACTORS: Age and Time since onset of injury/illness/exacerbation are also affecting patient's functional outcome.   REHAB POTENTIAL: Good  CLINICAL DECISION MAKING: Evolving/moderate complexity  EVALUATION COMPLEXITY: Moderate   GOALS: Goals reviewed with patient? Yes  SHORT TERM GOALS: Target date: 02/22/2023   Patient will be independent with initial HEP.  Baseline:  Goal status: met 10/8 2.  Decreased mid and low back pain by 30% with ADLS Baseline:  Goal status: met 10/8    LONG TERM GOALS: Target date: 05/17/2023   Patient will be independent with advanced/ongoing HEP to improve outcomes and carryover.  Baseline:  Goal status: met 2.  Patient will report 75% improvement in low back pain with ADLs and sit to stand transfer to improve QOL.  Baseline:  Goal status: partially met  3.  Patient will demonstrate functional pain free ROM to perform ADLs and to check  blind spots while biking.   Baseline:  Goal status: partially met: OK to left, limited on right  4.  Patient will demonstrate improved core strength to 4+/5 to stabilize posture. Baseline:  Goal status: met  5.  Patient will report 76 on lumbar FOTO to demonstrate improved functional ability.  Baseline: 58 Goal status:partially met  6.  Patient to demonstrate ability to achieve and maintain good spinal alignment/posturing and body mechanics needed for daily activities. Baseline:  Goal status:met 10/8  8. Patient will demonstrate improved HS and hip flexor flexibility to allow for neutral spine in standing. Baseline:  Goal status: met 10/8   PLAN: PHYSICAL THERAPY DISCHARGE SUMMARY  Visits from Start of Care: 17  Current functional level related to goals / functional outcomes: See clinical impressions above    Remaining deficits: As above   Education / Equipment: HEP   Patient agrees to discharge. Patient goals were met. Patient is being discharged due to meeting the stated rehab goals.  Lavinia Sharps, PT 05/07/23 5:08 PM Phone: [phone] Fax: [phone]

## 2023-05-21 ENCOUNTER — Other Ambulatory Visit: Payer: Self-pay | Admitting: Family Medicine

## 2023-05-21 DIAGNOSIS — Z136 Encounter for screening for cardiovascular disorders: Secondary | ICD-10-CM

## 2023-05-22 ENCOUNTER — Inpatient Hospital Stay
Admission: RE | Admit: 2023-05-22 | Discharge: 2023-05-22 | Discharge status: Home / Self Care | Payer: Medicare Other | Source: Ambulatory Visit | Attending: Family Medicine | Admitting: Family Medicine

## 2023-05-22 DIAGNOSIS — Z136 Encounter for screening for cardiovascular disorders: Secondary | ICD-10-CM

## 2023-05-23 ENCOUNTER — Encounter: Payer: Self-pay | Admitting: Physical Medicine and Rehabilitation

## 2023-05-23 ENCOUNTER — Ambulatory Visit: Payer: Medicare Other | Admitting: Physical Medicine and Rehabilitation

## 2023-05-23 DIAGNOSIS — M545 Low back pain, unspecified: Secondary | ICD-10-CM

## 2023-05-23 DIAGNOSIS — M7918 Myalgia, other site: Secondary | ICD-10-CM | POA: Diagnosis not present

## 2023-05-23 DIAGNOSIS — G8929 Other chronic pain: Secondary | ICD-10-CM | POA: Diagnosis not present

## 2023-05-23 DIAGNOSIS — M48061 Spinal stenosis, lumbar region without neurogenic claudication: Secondary | ICD-10-CM
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
## 2023-05-28 ENCOUNTER — Other Ambulatory Visit: Payer: Self-pay | Admitting: Internal Medicine

## 2023-05-28 DIAGNOSIS — M8580 Other specified disorders of bone density and structure, unspecified site: Secondary | ICD-10-CM

## 2023-06-19 ENCOUNTER — Other Ambulatory Visit: Payer: Self-pay | Admitting: Physical Medicine and Rehabilitation

## 2023-06-19 DIAGNOSIS — M5416 Radiculopathy, lumbar region: Secondary | ICD-10-CM

## 2023-06-26 ENCOUNTER — Ambulatory Visit: Payer: Medicare Other | Admitting: Internal Medicine

## 2023-06-26 ENCOUNTER — Encounter: Payer: Self-pay | Admitting: Internal Medicine

## 2023-06-26 VITALS — BP 112/70 | HR 80 | Ht 69.0 in | Wt 168.4 lb

## 2023-06-26 DIAGNOSIS — K638219 Small intestinal bacterial overgrowth, unspecified: Secondary | ICD-10-CM

## 2023-06-26 DIAGNOSIS — R634 Abnormal weight loss: Secondary | ICD-10-CM

## 2023-06-26 DIAGNOSIS — K580 Irritable bowel syndrome with diarrhea: Secondary | ICD-10-CM | POA: Diagnosis not present

## 2023-06-26 NOTE — Patient Instructions (Signed)
 Glad you are doing better.   Continue your current plan.   The medicine that may help your back pain is generic Cymbalta- Duloxetine.   _______________________________________________________  If your blood pressure at your visit was 140/90 or greater, please contact your primary care physician to follow up on this.  _______________________________________________________  If you are age 66 or older, your body mass index should be between 23-30. Your Body mass index is 24.86 kg/m. If this is out of the aforementioned range listed, please consider follow up with your Primary Care Provider.  If you are age 74 or younger, your body mass index should be between 19-25. Your Body mass index is 24.86 kg/m. If this is out of the aformentioned range listed, please consider follow up with your Primary Care Provider.   ________________________________________________________  The [facility] providers would like to encourage you to use MYCHART to communicate with providers for non-urgent requests or questions.  Due to long hold times on the telephone, sending your provider a message by [facility] may be a faster and more efficient way to get a response.  Please allow 48 business hours for a response.  Please remember that this is for non-urgent requests.  _______________________________________________________   I appreciate the opportunity to care for you. Loy Ruff, MD, [facility]

## 2023-06-26 NOTE — Progress Notes (Signed)
 Mason Irwin 68 y.o. [birth year] [id number]  Assessment & Plan:   Encounter Diagnoses  Name Primary?   Small intestinal bacterial overgrowth (SIBO) Yes   Irritable bowel syndrome with diarrhea    Loss of weight - resolved    Improved - satisfied w/ quality of life currently so he will follow-up as needed.  During the discussion about his back pain I mentioned that duloxetine has been successfully used to treat pain like that and sometimes helped IBS issues as well.  He will explore at his discretion and discussed with PCP or other providers if desired.   Subjective:  Gastroenterology summary:   Chronic diarrhea issues-normal colonoscopy except for hyperplastic rectal polyp but normal biopsies and terminal ileum February 2022.  Mildly positive SIBO test June 2022 -22 ppm increase H2 27 ppm total.  Try diet then took Xifaxan  December 2022. Returned 10/24 - some wgt loss, recurrent sxs retx Xifaxan    ---------------------------------------------------------------------------------------------- Discussed the use of AI scribe software for clinical note transcription with the patient, who gave verbal consent to proceed.   Chief Complaint: Follow-up SIBO and weight loss  HPI 68 year old white man with a history of SIBO and weight loss last seen in October 2024.  The patient reports significant improvement in his symptoms following a course of Xifaxan  prescribed in October. He noted that his bowel movements became solid and regular, with no nocturnal disturbances. However, he reported a gradual return of symptoms after a week. He has been self-managing his symptoms with glycine, which he found to be effective.  The patient also reported a change in his defecation pattern, with bowel movements occurring later in the day, which was not the case previously. Despite these changes, he reported no nocturnal disturbances due to bowel movements. He did, however, report waking up at night  due to prostate issues.  The patient also reported chronic back pain, which he believes may be related to his IBS due to a concept known as nerve sensitization. He has been managing his back pain with yoga, which he found to be beneficial.  The patient identified almond butter as a potential trigger for his IBS symptoms, which was not the case a decade ago. He reported a low sugar diet, with the exception of a few cookies each day and natural sugars. He also reported a high intake of milk for weight gain, which he is considering reducing due to potential lactose intolerance.  The patient's weight has increased since his last visit, which he attributes to a decrease in cycling and an increase in food intake. He reported no significant gas issues. He expressed satisfaction with his current condition and is not seeking additional medication at this time.      Wt Readings from Last 3 Encounters:  06/26/23 168 lb 6 oz (76.4 kg)  03/20/23 156 lb 4 oz (70.9 kg)  12/01/21 164 lb 9.6 oz (74.7 kg)     No Known Allergies Current Meds  Medication Sig   acetaminophen  (TYLENOL ) 500 MG tablet Take 500 mg by mouth as needed.   acyclovir  (ZOVIRAX ) 400 MG tablet Take 400 mg by mouth 2 (two) times daily.   cetirizine (ZYRTEC) 10 MG tablet Take 10 mg by mouth daily as needed for allergies.   GLUTAMINE PO Take 1 Dose by mouth daily.   GLYCINE PO Take 1 Dose by mouth daily.   Multiple Vitamin (MULTIVITAMIN) tablet Take 1 tablet by mouth daily.   traMADol  (ULTRAM ) 50 MG tablet Take 25 mg by  mouth as needed.   Past Medical History:  Diagnosis Date   Allergy    Anxiety    Central sensitization to pain    Eczema    hands, chronic and recurrent   Family history of prostate cancer in father    FH: early coronary artery disease    Hemorrhoids    Hyperlipidemia    Hyperplastic colon polyp    Pure hypercholesterolemia    S/P appendectomy    S/P bilateral inguinal hernia repair    60 weeks old    Small  intestinal bacterial overgrowth (SIBO)    Past Surgical History:  Procedure Laterality Date   APPENDECTOMY     age 76 weeks   COLONOSCOPY     INGUINAL HERNIA REPAIR Bilateral    with appendectomy, age 33 weeks   LUMBAR DISC SURGERY     L4-L5   Social History   Social History Narrative   Married no children   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   Review of Systems BPH problems - considering HOLEP Low back pain  Objective:   Physical Exam BP 112/70 (BP Location: Left Arm, Patient Position: Sitting, Cuff Size: Normal)   Pulse 80   Ht 5\' 9"  (1.753 m)   Wt 168 lb 6 oz (76.4 kg)   BMI 24.86 kg/m    22 minutes total time spent before during and after patient interaction, on this visit

## 2023-07-04 ENCOUNTER — Other Ambulatory Visit: Payer: Self-pay

## 2023-07-04 ENCOUNTER — Ambulatory Visit: Payer: Medicare Other | Admitting: Physical Medicine and Rehabilitation

## 2023-07-04 DIAGNOSIS — M5416 Radiculopathy, lumbar region: Secondary | ICD-10-CM

## 2023-07-04 MED ORDER — METHYLPREDNISOLONE ACETATE 40 MG/ML IJ SUSP
40.0000 mg | Freq: Once | INTRAMUSCULAR | Status: AC
Start: 2023-07-04 — End: 2023-07-04
  Administered 2023-07-04: 40 mg

## 2023-07-04 NOTE — Progress Notes (Signed)
Functional Pain Scale - descriptive words and definitions  Uncomfortable (3)  Pain is present but can complete all ADL's/sleep is slightly affected and passive distraction only gives marginal relief. Mild range order  Average Pain 3  Pain gets worse though the day.   +Driver, -BT, -Dye Allergies.

## 2023-07-04 NOTE — Patient Instructions (Signed)

## 2023-07-11 NOTE — Progress Notes (Signed)
Maddox A Hildebrandt - 68 y.o. male MRN [id number]  Date of birth: 04/15/1956  Office Visit Note: Visit Date: 07/04/2023 PCP: Farris Has, MD Referred by: Farris Has, MD  Subjective: Chief Complaint  Patient presents with   Lower Back - Pain   HPI:  Caydon A Sampsel is a 68 y.o. male who comes in today at the request of Ellin Goodie, FNP for planned Right L4-5 Lumbar Interlaminar epidural steroid injection with fluoroscopic guidance.  The patient has failed conservative care including home exercise, medications, time and activity modification.  This injection will be diagnostic and hopefully therapeutic.  Please see requesting physician notes for further details and justification.  He reports to me today that he really has taken to our message about the experiences of pain and anatomy etc.  He is doing a lot of yoga now with strengthening on his own and doing the yoga at his home and he feels like a lot of that strengthening and stability along with getting used to how his body is functioning has really helped his pain level quite a bit.  We did reassure him today that he really does not have anything in the spine that would necessitate much in the way of activity modification other than the fact that he is having pain.  He seems to be doing quite well.   ROS Otherwise per HPI.  Assessment & Plan: Visit Diagnoses:    ICD-10-CM   1. Lumbar radiculopathy  M54.16 XR C-ARM NO REPORT    Epidural Steroid injection    methylPREDNISolone acetate (DEPO-MEDROL) injection 40 mg      Plan: No additional findings.   Meds & Orders:  Meds ordered this encounter  Medications   methylPREDNISolone acetate (DEPO-MEDROL) injection 40 mg    Orders Placed This Encounter  Procedures   XR C-ARM NO REPORT   Epidural Steroid injection    Follow-up: Return for visit to requesting provider as needed.   Procedures: No procedures performed  Lumbar Epidural Steroid Injection - Interlaminar Approach  with Fluoroscopic Guidance  Patient: Nissan A Zebrowski      Date of Birth: May 11, 1956 MRN: [id number] PCP: Farris Has, MD      Visit Date: 07/04/2023   Universal Protocol:     Consent Given By: the patient  Position: PRONE  Additional Comments: Vital signs were monitored before and after the procedure. Patient was prepped and draped in the usual sterile fashion. The correct patient, procedure, and site was verified.   Injection Procedure Details:   Procedure diagnoses: Lumbar radiculopathy [M54.16]   Meds Administered:  Meds ordered this encounter  Medications   methylPREDNISolone acetate (DEPO-MEDROL) injection 40 mg     Laterality: Right  Location/Site:  L4-5  Needle: 3.5 in., 20 ga. Tuohy  Needle Placement: Paramedian epidural  Findings:   -Comments: Excellent flow of contrast into the epidural space.  Procedure Details: Using a paramedian approach from the side mentioned above, the region overlying the inferior lamina was localized under fluoroscopic visualization and the soft tissues overlying this structure were infiltrated with 4 ml. of 1% Lidocaine without Epinephrine. The Tuohy needle was inserted into the epidural space using a paramedian approach.   The epidural space was localized using loss of resistance along with counter oblique bi-planar fluoroscopic views.  After negative aspirate for air, blood, and CSF, a 2 ml. volume of Isovue-250 was injected into the epidural space and the flow of contrast was observed. Radiographs were obtained for documentation purposes.  The injectate was administered into the level noted above.   Additional Comments:  No complications occurred Dressing: 2 x 2 sterile gauze and Band-Aid    Post-procedure details: Patient was observed during the procedure. Post-procedure instructions were reviewed.  Patient left the clinic in stable condition.   Clinical History: Narrative & Impression CLINICAL DATA:  Chronic low  back pain without sciatica   EXAM: MRI LUMBAR SPINE WITHOUT CONTRAST   TECHNIQUE: Multiplanar, multisequence MR imaging of the lumbar spine was performed. No intravenous contrast was administered.   COMPARISON:  None Available.   FINDINGS: Segmentation:  Standard.   Alignment:  No static listhesis.  Levocurvature of the lumbar spine.   Vertebrae: No acute fracture, evidence of discitis, or aggressive bone lesion.   Conus medullaris and cauda equina: Conus extends to the T12-L1 level. Conus and cauda equina appear normal.   Paraspinal and other soft tissues: No acute paraspinal abnormality.   Disc levels:   Disc spaces: Moderate disc height loss at T11-12, T12-L1, L1-2, L4-5. Mild disc height loss at L2-3 with Modic 1 endplate changes.   T12-L1: Mild disc osteophyte complex. Mild bilateral facet arthropathy. Moderate right foraminal stenosis. No left foraminal stenosis. No central canal stenosis.   L1-L2: Mild disc osteophyte complex. No foraminal or central canal stenosis.   L2-L3: Mild disc osteophyte complex. Moderate bilateral facet arthropathy. Moderate central canal stenosis. Right lateral recess stenosis which may impinge upon the right L3 nerve root. Mild right foraminal stenosis. No left foraminal stenosis.   L3-L4: Mild disc osteophyte complex. Mild bilateral facet arthropathy. Moderate central canal stenosis. Left subarticular recess stenosis which may impinge upon the left L4 nerve root. Moderate left foraminal stenosis. No right foraminal stenosis.   L4-L5: Mild disc bulge. Mild bilateral facet arthropathy. Moderate right and mild-moderate left foraminal stenosis. No central canal stenosis.   L5-S1: No significant disc bulge. No neural foraminal stenosis. No central canal stenosis.   IMPRESSION: 1. Diffuse lumbar spine spondylosis as described above. 2. No acute osseous injury of the lumbar spine.     Electronically Signed   By: Elige Ko  M.D.   On: 05/21/2023 17:50     Objective:  VS:  HT:    WT:   BMI:     BP:   HR: bpm  TEMP: ( )  RESP:  Physical Exam Vitals and nursing note reviewed.  Constitutional:      General: He is not in acute distress.    Appearance: Normal appearance. He is not ill-appearing.  HENT:     Head: Normocephalic and atraumatic.     Right Ear: External ear normal.     Left Ear: External ear normal.     Nose: No congestion.  Eyes:     Extraocular Movements: Extraocular movements intact.  Cardiovascular:     Rate and Rhythm: Normal rate.     Pulses: Normal pulses.  Pulmonary:     Effort: Pulmonary effort is normal. No respiratory distress.  Abdominal:     General: There is no distension.     Palpations: Abdomen is soft.  Musculoskeletal:        General: No tenderness or signs of injury.     Cervical back: Neck supple.     Right lower leg: No edema.     Left lower leg: No edema.     Comments: Patient has good distal strength without clonus.  Skin:    Findings: No erythema or rash.  Neurological:  General: No focal deficit present.     Mental Status: He is alert and oriented to person, place, and time.     Sensory: No sensory deficit.     Motor: No weakness or abnormal muscle tone.     Coordination: Coordination normal.  Psychiatric:        Mood and Affect: Mood normal.        Behavior: Behavior normal.      Imaging: No results found.

## 2023-07-11 NOTE — Procedures (Signed)
Lumbar Epidural Steroid Injection - Interlaminar Approach with Fluoroscopic Guidance  Patient: Mason Irwin      Date of Birth: 1955/08/20 MRN: [id number] PCP: Farris Has, MD      Visit Date: 07/04/2023   Universal Protocol:     Consent Given By: the patient  Position: PRONE  Additional Comments: Vital signs were monitored before and after the procedure. Patient was prepped and draped in the usual sterile fashion. The correct patient, procedure, and site was verified.   Injection Procedure Details:   Procedure diagnoses: Lumbar radiculopathy [M54.16]   Meds Administered:  Meds ordered this encounter  Medications   methylPREDNISolone acetate (DEPO-MEDROL) injection 40 mg     Laterality: Right  Location/Site:  L4-5  Needle: 3.5 in., 20 ga. Tuohy  Needle Placement: Paramedian epidural  Findings:   -Comments: Excellent flow of contrast into the epidural space.  Procedure Details: Using a paramedian approach from the side mentioned above, the region overlying the inferior lamina was localized under fluoroscopic visualization and the soft tissues overlying this structure were infiltrated with 4 ml. of 1% Lidocaine without Epinephrine. The Tuohy needle was inserted into the epidural space using a paramedian approach.   The epidural space was localized using loss of resistance along with counter oblique bi-planar fluoroscopic views.  After negative aspirate for air, blood, and CSF, a 2 ml. volume of Isovue-250 was injected into the epidural space and the flow of contrast was observed. Radiographs were obtained for documentation purposes.    The injectate was administered into the level noted above.   Additional Comments:  No complications occurred Dressing: 2 x 2 sterile gauze and Band-Aid    Post-procedure details: Patient was observed during the procedure. Post-procedure instructions were reviewed.  Patient left the clinic in stable condition.

## 2023-09-04 ENCOUNTER — Encounter: Payer: Self-pay | Admitting: Orthopedic Surgery

## 2023-09-04 ENCOUNTER — Ambulatory Visit: Admitting: Orthopedic Surgery

## 2023-09-04 ENCOUNTER — Other Ambulatory Visit (INDEPENDENT_AMBULATORY_CARE_PROVIDER_SITE_OTHER): Payer: Self-pay

## 2023-09-04 DIAGNOSIS — M25562 Pain in left knee: Secondary | ICD-10-CM | POA: Diagnosis not present

## 2023-09-04 NOTE — Progress Notes (Signed)
 Office Visit Note   Patient: Mason Irwin           Date of Birth: 1956/03/06           MRN: [id number] Visit Date: 09/04/2023 Requested by: Farris Has, MD [address] [place],  [place] [address] PCP: Farris Has, MD  Subjective: Chief Complaint  Patient presents with   Left Knee - Pain    No recent falls or injuries  Patient is retired and states he still has tingling and weakness to his left knee Patient is very active cycling     HPI: Mason Irwin is a 68 y.o. male who presents to the office reporting 2 years of left knee pain.  Limits his cycling left knee more than right knee.  Hurts for him to kneel and squat.  The knee feels unstable.  Feels like at times there is a "nodule in the back of my knee".  Does not really stop him from doing anything.  Leg press hurts him occasionally.  Describes the pain as typically deeper inside the knee.  Occasional lateral sided pain.  Denies any mechanical symptoms.  He does have a history also of scoliosis and a Morton's neuroma.  Occasionally hurts him to walk.  He notices the knee is most symptomatic when he is really trying to push hard on one of his bike rides..                ROS: All systems reviewed are negative as they relate to the chief complaint within the history of present illness.  Patient denies fevers or chills.  Assessment & Plan: Visit Diagnoses:  1. Left knee pain, unspecified chronicity     Plan: Impression is left knee pain with fairly reasonable looking radiographs and trace effusion.  Hard to say exactly if there is a mechanical or structural problem in the knee at this time.  Does not look like iliotibial band bursitis.  Could be degenerative meniscal pathology but he is having no mechanical symptoms.  MRI scanning would be the next imaging study or we could consider an aspiration and injection.  He wants to hold off on that for now.  Will see him back as needed  Follow-Up Instructions: No  follow-ups on file.   Orders:  Orders Placed This Encounter  Procedures   XR Knee 1-2 Views Left   No orders of the defined types were placed in this encounter.     Procedures: No procedures performed   Clinical Data: No additional findings.  Objective: Vital Signs: There were no vitals taken for this visit.  Physical Exam:  Constitutional: Patient appears well-developed HEENT:  Head: Normocephalic Eyes:EOM are normal Neck: Normal range of motion Cardiovascular: Normal rate Pulmonary/chest: Effort normal Neurologic: Patient is alert Skin: Skin is warm Psychiatric: Patient has normal mood and affect  Ortho Exam: Ortho exam demonstrates full active and passive range of motion of the left knee.  No groin pain with internal/external Tatian of the leg.  Pedal pulses palpable.  Ankle dorsiflexion intact.  Collateral and cruciate ligaments are stable.  No other masses lymphadenopathy or skin changes noted in that left knee region.  Gait nonantalgic  Specialty Comments:  Narrative & Impression CLINICAL DATA:  Chronic low back pain without sciatica   EXAM: MRI LUMBAR SPINE WITHOUT CONTRAST   TECHNIQUE: Multiplanar, multisequence MR imaging of the lumbar spine was performed. No intravenous contrast was administered.   COMPARISON:  None Available.  FINDINGS: Segmentation:  Standard.   Alignment:  No static listhesis.  Levocurvature of the lumbar spine.   Vertebrae: No acute fracture, evidence of discitis, or aggressive bone lesion.   Conus medullaris and cauda equina: Conus extends to the T12-L1 level. Conus and cauda equina appear normal.   Paraspinal and other soft tissues: No acute paraspinal abnormality.   Disc levels:   Disc spaces: Moderate disc height loss at T11-12, T12-L1, L1-2, L4-5. Mild disc height loss at L2-3 with Modic 1 endplate changes.   T12-L1: Mild disc osteophyte complex. Mild bilateral facet arthropathy. Moderate right foraminal stenosis.  No left foraminal stenosis. No central canal stenosis.   L1-L2: Mild disc osteophyte complex. No foraminal or central canal stenosis.   L2-L3: Mild disc osteophyte complex. Moderate bilateral facet arthropathy. Moderate central canal stenosis. Right lateral recess stenosis which may impinge upon the right L3 nerve root. Mild right foraminal stenosis. No left foraminal stenosis.   L3-L4: Mild disc osteophyte complex. Mild bilateral facet arthropathy. Moderate central canal stenosis. Left subarticular recess stenosis which may impinge upon the left L4 nerve root. Moderate left foraminal stenosis. No right foraminal stenosis.   L4-L5: Mild disc bulge. Mild bilateral facet arthropathy. Moderate right and mild-moderate left foraminal stenosis. No central canal stenosis.   L5-S1: No significant disc bulge. No neural foraminal stenosis. No central canal stenosis.   IMPRESSION: 1. Diffuse lumbar spine spondylosis as described above. 2. No acute osseous injury of the lumbar spine.     Electronically Signed   By: Elige Ko M.D.   On: 05/21/2023 17:50  Imaging: XR Knee 1-2 Views Left Result Date: 09/04/2023 AP lateral radiographs left knee reviewed.  Very mild medial joint space narrowing is present.  Lateral joint space appears preserved.  Mild degenerative changes present in the patellofemoral joint.  Alignment intact.  No acute fracture    PMFS History: Patient Active Problem List   Diagnosis Date Noted   Morton's neuroma, right 04/05/2021   Injury of right suprascapular nerve 03/02/2021   Small intestinal bacterial overgrowth (SIBO) 10/20/2020   Fracture of shaft of clavicle, closed 01/14/2020   Routine general medical examination at a health care facility 09/29/2013   Low back pain 10/04/2011   HSV infection 07/10/2011   DERMATITIS, CONTACT, NOS 03/17/2007   Past Medical History:  Diagnosis Date   Allergy    Anxiety    Central sensitization to pain    Eczema     hands, chronic and recurrent   Family history of prostate cancer in father    FH: early coronary artery disease    Hemorrhoids    Hyperlipidemia    Hyperplastic colon polyp    Pure hypercholesterolemia    S/P appendectomy    S/P bilateral inguinal hernia repair    78 weeks old    Small intestinal bacterial overgrowth (SIBO)     Family History  Problem Relation Age of Onset   Prostate cancer Father 44   Heart disease Father    Hypertension Other    Heart attack Mother    Diabetes Maternal Grandmother    Heart attack Maternal Grandfather    Heart disease Paternal Uncle    Colon cancer Neg Hx     Past Surgical History:  Procedure Laterality Date   APPENDECTOMY     age 8 weeks   COLONOSCOPY     INGUINAL HERNIA REPAIR Bilateral    with appendectomy, age 8 weeks   LUMBAR DISC SURGERY     L4-L5  Social History   Occupational History   Occupation: Leisure centre manager  Tobacco Use   Smoking status: Never   Smokeless tobacco: Never  Vaping Use   Vaping status: Never Used  Substance and Sexual Activity   Alcohol use: Yes    Alcohol/week: 3.0 standard drinks of alcohol    Types: 3 Standard drinks or equivalent per week    Comment: occasional mixed drink   Drug use: No   Sexual activity: Not on file

## 2023-09-26 ENCOUNTER — Ambulatory Visit (INDEPENDENT_AMBULATORY_CARE_PROVIDER_SITE_OTHER): Payer: Medicare Other | Admitting: Urology

## 2023-09-26 VITALS — BP 142/92 | HR 68 | Ht 69.0 in | Wt 168.0 lb

## 2023-09-26 DIAGNOSIS — N401 Enlarged prostate with lower urinary tract symptoms: Secondary | ICD-10-CM

## 2023-09-26 DIAGNOSIS — R399 Unspecified symptoms and signs involving the genitourinary system: Secondary | ICD-10-CM

## 2023-09-26 LAB — BLADDER SCAN AMB NON-IMAGING: Scan Result: 7

## 2023-09-26 NOTE — Patient Instructions (Signed)

## 2023-09-26 NOTE — Progress Notes (Signed)
 09/26/23 2:50 PM   Dorsie A Manner 09-04-1955 [id number]  CC: BPH and urinary symptoms, PSA screening  HPI: 68 year old male who made an appointment with [place] to discuss HOLEP and his urinary symptoms.  He has been followed by Dr. Jennette Bill and [facility] who reportedly performed cystoscopy showing a large median lobe and recommended TURP.  Unfortunately those records are not available to me.  His primary urinary complaints are weak stream and nocturia 4 times per night.  IPSS score today is 27, with quality-of-life mixed.  No prior cross-sectional imaging to review.  PSA normal at 1.6 recently.     PMH: Past Medical History:  Diagnosis Date   Allergy    Anxiety    Central sensitization to pain    Eczema    hands, chronic and recurrent   Family history of prostate cancer in father    FH: early coronary artery disease    Hemorrhoids    Hyperlipidemia    Hyperplastic colon polyp    Pure hypercholesterolemia    S/P appendectomy    S/P bilateral inguinal hernia repair    68 weeks old    Small intestinal bacterial overgrowth (SIBO)     Surgical History: Past Surgical History:  Procedure Laterality Date   APPENDECTOMY     age 49 weeks   COLONOSCOPY     INGUINAL HERNIA REPAIR Bilateral    with appendectomy, age 494 weeks   LUMBAR DISC SURGERY     L4-L5    Family History: Family History  Problem Relation Age of Onset   Prostate cancer Father 64   Heart disease Father    Hypertension Other    Heart attack Mother    Diabetes Maternal Grandmother    Heart attack Maternal Grandfather    Heart disease Paternal Uncle    Colon cancer Neg Hx     Social History:  reports that he has never smoked. He has never used smokeless tobacco. He reports current alcohol use of about 3.0 standard drinks of alcohol per week. He reports that he does not use drugs.  Physical Exam: BP (!) 142/92   Pulse 68   Ht 5\' 9"  (1.753 m)   Wt 168 lb (76.2 kg)   BMI 24.81 kg/m    Constitutional:   Alert and oriented, No acute distress. Cardiovascular: No clubbing, cyanosis, or edema. Respiratory: Normal respiratory effort, no increased work of breathing. GI: Abdomen is soft, nontender, nondistended, no abdominal masses   Laboratory Data: PSA 1.6   Assessment & Plan:   68 year old male with BPH and obstructive symptoms, reported large prostate with median lobe on outside cystoscopy interested in HOLEP for definitive treatment.  Unfortunately those outside records are not available to me at this time and I will obtain those and review.  We discussed the risks and benefits of HoLEP at length.  The procedure requires general anesthesia and takes 1 to 2 hours, and a holmium laser is used to enucleate the prostate and push this tissue into the bladder.  A morcellator is then used to remove this tissue, which is sent for pathology.  The vast majority(>95%) of patients are able to discharge the same day with a catheter in place for 2 to 3 days, and will follow-up in clinic for a voiding trial.  We specifically discussed the risks of bleeding, infection, retrograde ejaculation, temporary urgency and urge incontinence, very low risk of long-term incontinence, urethral stricture/bladder neck contracture, pathologic evaluation of prostate tissue and possible detection of prostate  cancer or other malignancy, and possible need for additional procedures.  Will obtain outside urology records, can consider trial of medications, urodynamics, or HOLEP   Jay Meth, MD 09/26/2023  [facility] [address], [address] [place], [place] [address] [phone]

## 2023-10-01 ENCOUNTER — Other Ambulatory Visit: Payer: Self-pay

## 2023-10-01 ENCOUNTER — Other Ambulatory Visit: Payer: Self-pay | Admitting: Urology

## 2023-10-01 DIAGNOSIS — N401 Enlarged prostate with lower urinary tract symptoms: Secondary | ICD-10-CM

## 2023-10-01 DIAGNOSIS — N138 Other obstructive and reflux uropathy: Secondary | ICD-10-CM

## 2023-10-01 NOTE — Progress Notes (Signed)
Surgical Physician Order Form [facility] [place]  * Scheduling expectation : Next Available  *Length of Case: 2 hours  *Clearance needed: no  *Anticoagulation Instructions: Hold all anticoagulants  *Aspirin Instructions: Hold Aspirin  *Post-op visit Date/Instructions:  1-3 day cath removal  *Diagnosis: BPH w/urinary obstruction  *Procedure:     HOLEP (16109)   Additional orders: N/A  -Admit type: OUTpatient  -Anesthesia: General  -VTE Prophylaxis Standing Order SCD's       Other:   -Standing Lab Orders Per Anesthesia    Lab other: UA&Urine Culture  -Standing Test orders EKG/Chest x-ray per Anesthesia       Test other:   - Medications:  Ancef 2gm IV  -Other orders:  N/A

## 2023-10-03 ENCOUNTER — Telehealth: Payer: Self-pay

## 2023-10-03 NOTE — Telephone Encounter (Signed)
  Per Dr. Estanislao Heimlich, Patient is to be scheduled for Holmium Laser Enucleation of the Prostate   Mason Irwin was contacted and possible surgical dates were discussed, Friday July 18th, 2025 was agreed upon for surgery.   Patient was instructed that Dr. Estanislao Heimlich will require them to provide a pre-op UA & CX prior to surgery. This was ordered and scheduled drop off appointment was made for 12/16/2023.    Patient was directed to call [phone] between 1-3pm the day before surgery to find out surgical arrival time.  Instructions were given not to eat or drink from midnight on the night before surgery and have a driver for the day of surgery. On the surgery day patient was instructed to enter through the Medical Mall entrance of [facility] report the Same Day Surgery desk.   Pre-Admit Testing will be in contact via phone to set up an interview with the anesthesia team to review your history and medications prior to surgery.   Reminder of this information was sent via MyChart to the patient.

## 2023-10-03 NOTE — Progress Notes (Signed)
    [facility] Surgical Posting Form  Surgery Date: Date: 12/27/2023  Surgeon: Dr. Jay Meth, MD  Inpt ( No  )   Outpt (Yes)   Obs ( No  )   Diagnosis: N40.1, N13.8 Benign Prostatic Hyperplasia with Urinary Obstruction  -CPT: 82956  Surgery: Holmium Laser Enucleation of the Prostate  Stop Anticoagulations: Yes and hold ASA  Cardiac/Medical/Pulmonary Clearance needed: no  *Orders entered into EPIC  Date: 10/03/23   *Case booked in [place]  Date: 10/03/23  *Notified pt of Surgery: Date: 10/03/23  PRE-OP UA & CX: yes, will obtain in clinic on 12/16/2023  *Placed into Prior Authorization Work [place] Date: 10/03/23  Assistant/laser/rep:No

## 2023-10-15 ENCOUNTER — Other Ambulatory Visit: Payer: Self-pay | Admitting: Physical Medicine and Rehabilitation

## 2023-10-15 ENCOUNTER — Encounter: Payer: Self-pay | Admitting: Physical Medicine and Rehabilitation

## 2023-10-15 DIAGNOSIS — M5416 Radiculopathy, lumbar region: Secondary | ICD-10-CM

## 2023-10-29 ENCOUNTER — Ambulatory Visit: Admitting: Physical Medicine and Rehabilitation

## 2023-10-29 ENCOUNTER — Other Ambulatory Visit: Payer: Self-pay

## 2023-10-29 VITALS — BP 115/71

## 2023-10-29 DIAGNOSIS — M5416 Radiculopathy, lumbar region: Secondary | ICD-10-CM | POA: Diagnosis not present

## 2023-10-29 MED ORDER — METHYLPREDNISOLONE ACETATE 40 MG/ML IJ SUSP
40.0000 mg | Freq: Once | INTRAMUSCULAR | Status: AC
Start: 2023-10-29 — End: 2023-10-29
  Administered 2023-10-29: 40 mg

## 2023-10-29 NOTE — Patient Instructions (Signed)

## 2023-10-29 NOTE — Progress Notes (Signed)
Functional Pain Scale - descriptive words and definitions  Moderate (4)   Constantly aware of pain, can complete ADLs with modification/sleep marginally affected at times/passive distraction is of no use, but active distraction gives some relief. Moderate range order  Average Pain 4

## 2023-11-11 NOTE — Procedures (Signed)
 Lumbar Epidural Steroid Injection - Interlaminar Approach with Fluoroscopic Guidance  Patient: Mason Irwin      Date of Birth: Feb 03, 1956 MRN: [id number] PCP: Ronna Coho, MD      Visit Date: 10/29/2023   Universal Protocol:     Consent Given By: the patient  Position: PRONE  Additional Comments: Vital signs were monitored before and after the procedure. Patient was prepped and draped in the usual sterile fashion. The correct patient, procedure, and site was verified.   Injection Procedure Details:   Procedure diagnoses: Lumbar radiculopathy [M54.16]   Meds Administered:  Meds ordered this encounter  Medications   methylPREDNISolone  acetate (DEPO-MEDROL ) injection 40 mg     Laterality: Right  Location/Site:  L3-4  Needle: 3.5 in., 20 ga. Tuohy  Needle Placement: Paramedian epidural  Findings:   -Comments: Excellent flow of contrast into the epidural space.  Procedure Details: Using a paramedian approach from the side mentioned above, the region overlying the inferior lamina was localized under fluoroscopic visualization and the soft tissues overlying this structure were infiltrated with 4 ml. of 1% Lidocaine  without Epinephrine. The Tuohy needle was inserted into the epidural space using a paramedian approach.   The epidural space was localized using loss of resistance along with counter oblique bi-planar fluoroscopic views.  After negative aspirate for air, blood, and CSF, a 2 ml. volume of Isovue-250 was injected into the epidural space and the flow of contrast was observed. Radiographs were obtained for documentation purposes.    The injectate was administered into the level noted above.   Additional Comments:  The patient tolerated the procedure well Dressing: 2 x 2 sterile gauze and Band-Aid    Post-procedure details: Patient was observed during the procedure. Post-procedure instructions were reviewed.  Patient left the clinic in stable condition.

## 2023-11-11 NOTE — Progress Notes (Signed)
 Mason Irwin - 68 y.o. male MRN [id number]  Date of birth: March 04, 1956  Office Visit Note: Visit Date: 10/29/2023 PCP: Ronna Coho, MD Referred by: Ronna Coho, MD  Subjective: Chief Complaint  Patient presents with   Lower Back - Pain    L4-L5 IL   HPI:  Mason Irwin is a 68 y.o. male who comes in today for planned repeat Right L3-4  Lumbar Interlaminar epidural steroid injection with fluoroscopic guidance.  The patient has failed conservative care including home exercise, medications, time and activity modification.  This injection will be diagnostic and hopefully therapeutic.  Please see requesting physician notes for further details and justification. Patient received more than 50% pain relief from prior injection.   Referring: Elvan Hamel, FNP   ROS Otherwise per HPI.  Assessment & Plan: Visit Diagnoses:    ICD-10-CM   1. Lumbar radiculopathy  M54.16 XR C-ARM NO REPORT    Epidural Steroid injection    methylPREDNISolone  acetate (DEPO-MEDROL ) injection 40 mg      Plan: No additional findings.   Meds & Orders:  Meds ordered this encounter  Medications   methylPREDNISolone  acetate (DEPO-MEDROL ) injection 40 mg    Orders Placed This Encounter  Procedures   XR C-ARM NO REPORT   Epidural Steroid injection    Follow-up: Return if symptoms worsen or fail to improve.   Procedures: No procedures performed  Lumbar Epidural Steroid Injection - Interlaminar Approach with Fluoroscopic Guidance  Patient: Mason Irwin      Date of Birth: [birth year] MRN: [id number] PCP: Ronna Coho, MD      Visit Date: 10/29/2023   Universal Protocol:     Consent Given By: the patient  Position: PRONE  Additional Comments: Vital signs were monitored before and after the procedure. Patient was prepped and draped in the usual sterile fashion. The correct patient, procedure, and site was verified.   Injection Procedure Details:   Procedure diagnoses: Lumbar  radiculopathy [M54.16]   Meds Administered:  Meds ordered this encounter  Medications   methylPREDNISolone  acetate (DEPO-MEDROL ) injection 40 mg     Laterality: Right  Location/Site:  L3-4  Needle: 3.5 in., 20 ga. Tuohy  Needle Placement: Paramedian epidural  Findings:   -Comments: Excellent flow of contrast into the epidural space.  Procedure Details: Using a paramedian approach from the side mentioned above, the region overlying the inferior lamina was localized under fluoroscopic visualization and the soft tissues overlying this structure were infiltrated with 4 ml. of 1% Lidocaine  without Epinephrine. The Tuohy needle was inserted into the epidural space using a paramedian approach.   The epidural space was localized using loss of resistance along with counter oblique bi-planar fluoroscopic views.  After negative aspirate for air, blood, and CSF, a 2 ml. volume of Isovue-250 was injected into the epidural space and the flow of contrast was observed. Radiographs were obtained for documentation purposes.    The injectate was administered into the level noted above.   Additional Comments:  The patient tolerated the procedure well Dressing: 2 x 2 sterile gauze and Band-Aid    Post-procedure details: Patient was observed during the procedure. Post-procedure instructions were reviewed.  Patient left the clinic in stable condition.   Clinical History: Narrative & Impression CLINICAL DATA:  Chronic low back pain without sciatica   EXAM: MRI LUMBAR SPINE WITHOUT CONTRAST   TECHNIQUE: Multiplanar, multisequence MR imaging of the lumbar spine was performed. No intravenous contrast was administered.   COMPARISON:  None Available.  FINDINGS: Segmentation:  Standard.   Alignment:  No static listhesis.  Levocurvature of the lumbar spine.   Vertebrae: No acute fracture, evidence of discitis, or aggressive bone lesion.   Conus medullaris and cauda equina: Conus extends  to the T12-L1 level. Conus and cauda equina appear normal.   Paraspinal and other soft tissues: No acute paraspinal abnormality.   Disc levels:   Disc spaces: Moderate disc height loss at T11-12, T12-L1, L1-2, L4-5. Mild disc height loss at L2-3 with Modic 1 endplate changes.   T12-L1: Mild disc osteophyte complex. Mild bilateral facet arthropathy. Moderate right foraminal stenosis. No left foraminal stenosis. No central canal stenosis.   L1-L2: Mild disc osteophyte complex. No foraminal or central canal stenosis.   L2-L3: Mild disc osteophyte complex. Moderate bilateral facet arthropathy. Moderate central canal stenosis. Right lateral recess stenosis which may impinge upon the right L3 nerve root. Mild right foraminal stenosis. No left foraminal stenosis.   L3-L4: Mild disc osteophyte complex. Mild bilateral facet arthropathy. Moderate central canal stenosis. Left subarticular recess stenosis which may impinge upon the left L4 nerve root. Moderate left foraminal stenosis. No right foraminal stenosis.   L4-L5: Mild disc bulge. Mild bilateral facet arthropathy. Moderate right and mild-moderate left foraminal stenosis. No central canal stenosis.   L5-S1: No significant disc bulge. No neural foraminal stenosis. No central canal stenosis.   IMPRESSION: 1. Diffuse lumbar spine spondylosis as described above. 2. No acute osseous injury of the lumbar spine.     Electronically Signed   By: Onnie Bilis M.D.   On: 05/21/2023 17:50     Objective:  VS:  HT:    WT:   BMI:     BP:115/71  HR: bpm  TEMP: ( )  RESP:  Physical Exam Vitals and nursing note reviewed.  Constitutional:      General: He is not in acute distress.    Appearance: Normal appearance. He is well-developed.  HENT:     Head: Normocephalic and atraumatic.  Eyes:     Conjunctiva/sclera: Conjunctivae normal.     Pupils: Pupils are equal, round, and reactive to light.  Cardiovascular:     Rate and  Rhythm: Normal rate.     Pulses: Normal pulses.     Heart sounds: Normal heart sounds.  Pulmonary:     Effort: Pulmonary effort is normal. No respiratory distress.  Musculoskeletal:     Cervical back: Normal range of motion and neck supple. No rigidity.     Right lower leg: No edema.     Left lower leg: No edema.  Skin:    General: Skin is warm and dry.     Findings: No erythema or rash.  Neurological:     General: No focal deficit present.     Mental Status: He is alert and oriented to person, place, and time.     Sensory: No sensory deficit.     Coordination: Coordination normal.     Gait: Gait normal.  Psychiatric:        Mood and Affect: Mood normal.        Behavior: Behavior normal.      Imaging: No results found.

## 2023-11-14 DIAGNOSIS — G5603 Carpal tunnel syndrome, bilateral upper limbs: Secondary | ICD-10-CM | POA: Insufficient documentation

## 2023-11-21 ENCOUNTER — Ambulatory Visit: Admitting: Podiatry

## 2023-11-28 ENCOUNTER — Encounter: Payer: Self-pay | Admitting: Podiatry

## 2023-11-28 ENCOUNTER — Ambulatory Visit (INDEPENDENT_AMBULATORY_CARE_PROVIDER_SITE_OTHER): Admitting: Podiatry

## 2023-11-28 DIAGNOSIS — L603 Nail dystrophy: Secondary | ICD-10-CM | POA: Diagnosis not present

## 2023-11-28 DIAGNOSIS — L600 Ingrowing nail: Secondary | ICD-10-CM | POA: Diagnosis not present

## 2023-12-02 NOTE — Progress Notes (Signed)
 Subjective:  Patient ID: Mason Irwin, male    DOB: 1956-04-27,  MRN: [id number] HPI Chief Complaint  Patient presents with   Nail Problem    Rm8 Nail fungus bilateral toes/several years/thick and yellow/no treatments    68 y.o. male presents with the above complaint.   ROS: Denies fever chills nausea vomit muscle aches pains calf pain back pain chest pain shortness of breath.  Past Medical History:  Diagnosis Date   Allergy    Anxiety    Central sensitization to pain    Eczema    hands, chronic and recurrent   Family history of prostate cancer in father    FH: early coronary artery disease    Hemorrhoids    Hyperlipidemia    Hyperplastic colon polyp    Pure hypercholesterolemia    S/P appendectomy    S/P bilateral inguinal hernia repair    95 weeks old    Small intestinal bacterial overgrowth (SIBO)    Past Surgical History:  Procedure Laterality Date   APPENDECTOMY     age 72 weeks   COLONOSCOPY     INGUINAL HERNIA REPAIR Bilateral    with appendectomy, age 72 weeks   LUMBAR DISC SURGERY     L4-L5    Current Outpatient Medications:    acetaminophen  (TYLENOL ) 500 MG tablet, Take 500 mg by mouth as needed., Disp: , Rfl:    acyclovir  (ZOVIRAX ) 400 MG tablet, Take 400 mg by mouth 2 (two) times daily., Disp: , Rfl:    cetirizine (ZYRTEC) 10 MG tablet, Take 10 mg by mouth daily as needed for allergies., Disp: , Rfl:    GLUTAMINE PO, Take 1 Dose by mouth daily., Disp: , Rfl:    GLYCINE PO, Take 1 Dose by mouth daily., Disp: , Rfl:    methylPREDNISolone  (MEDROL  DOSEPAK) 4 MG TBPK tablet, SMARTSIG:- Tablet(s) By Mouth -, Disp: , Rfl:    Multiple Vitamin (MULTIVITAMIN) tablet, Take 1 tablet by mouth daily., Disp: , Rfl:    traMADol  (ULTRAM ) 50 MG tablet, Take 25 mg by mouth as needed., Disp: , Rfl:    traMADol  (ULTRAM ) 50 MG tablet, Take 50 mg by mouth., Disp: , Rfl:   No Known Allergies Review of Systems Objective:  There were no vitals filed for this  visit.  General: Well developed, nourished, in no acute distress, alert and oriented x3   Dermatological: Skin is warm, dry and supple bilateral. Nails x 10 are well maintained however the hallux bilaterally does demonstrate some thick discolored areas with subungual debris.; remaining integument appears unremarkable at this time. There are no open sores, no preulcerative lesions, no rash or signs of infection present.  Vascular: Dorsalis Pedis artery and Posterior Tibial artery pedal pulses are 2/4 bilateral with immedate capillary fill time. Pedal hair growth present. No varicosities and no lower extremity edema present bilateral.   Neruologic: Grossly intact via light touch bilateral. Vibratory intact via tuning fork bilateral. Protective threshold with Semmes Wienstein monofilament intact to all pedal sites bilateral. Patellar and Achilles deep tendon reflexes 2+ bilateral. No Babinski or clonus noted bilateral.   Musculoskeletal: No gross boney pedal deformities bilateral. No pain, crepitus, or limitation noted with foot and ankle range of motion bilateral. Muscular strength 5/5 in all groups tested bilateral.  Gait: Unassisted, Nonantalgic.    Radiographs:  None taken  Assessment & Plan:   Assessment: Mild ingrown nail fibular border  Plan: Samples of the skin and nail taken today for pathologic evaluation.  Will  follow-up with him in 1 month  Will perform an ingrown procedure when he comes in next visit     Vibha Ferdig T. [place], [place]

## 2023-12-03 NOTE — Addendum Note (Signed)
 Addended by: ELAYNE KNEE E on: 12/03/2023 11:29 AM   Modules accepted: Level of Service

## 2023-12-10 ENCOUNTER — Other Ambulatory Visit: Payer: Self-pay | Admitting: Podiatry

## 2023-12-10 DIAGNOSIS — N138 Other obstructive and reflux uropathy: Secondary | ICD-10-CM

## 2023-12-10 HISTORY — DX: Other obstructive and reflux uropathy: N13.8

## 2023-12-16 ENCOUNTER — Other Ambulatory Visit

## 2023-12-16 DIAGNOSIS — N401 Enlarged prostate with lower urinary tract symptoms: Secondary | ICD-10-CM

## 2023-12-16 LAB — URINALYSIS, COMPLETE
Bilirubin, UA: NEGATIVE
Glucose, UA: NEGATIVE
Ketones, UA: NEGATIVE
Leukocytes,UA: NEGATIVE
Nitrite, UA: NEGATIVE
Protein,UA: NEGATIVE
RBC, UA: NEGATIVE
Specific Gravity, UA: 1.02 (ref 1.005–1.030)
Urobilinogen, Ur: 0.2 mg/dL (ref 0.2–1.0)
pH, UA: 6 (ref 5.0–7.5)

## 2023-12-16 LAB — MICROSCOPIC EXAMINATION: Bacteria, UA: NONE SEEN

## 2023-12-19 ENCOUNTER — Other Ambulatory Visit: Payer: Self-pay

## 2023-12-19 ENCOUNTER — Encounter
Admission: RE | Admit: 2023-12-19 | Discharge: 2023-12-19 | Disposition: A | Discharge status: Home / Self Care | Source: Ambulatory Visit | Attending: Urology | Admitting: Urology

## 2023-12-19 VITALS — Ht 69.0 in | Wt 155.0 lb

## 2023-12-19 DIAGNOSIS — Z01810 Encounter for preprocedural cardiovascular examination: Secondary | ICD-10-CM

## 2023-12-19 DIAGNOSIS — N138 Other obstructive and reflux uropathy: Secondary | ICD-10-CM

## 2023-12-19 DIAGNOSIS — Z01812 Encounter for preprocedural laboratory examination: Secondary | ICD-10-CM

## 2023-12-19 DIAGNOSIS — N401 Enlarged prostate with lower urinary tract symptoms: Secondary | ICD-10-CM | POA: Diagnosis not present

## 2023-12-19 DIAGNOSIS — Z01818 Encounter for other preprocedural examination: Secondary | ICD-10-CM | POA: Diagnosis not present

## 2023-12-19 DIAGNOSIS — E785 Hyperlipidemia, unspecified: Secondary | ICD-10-CM

## 2023-12-19 DIAGNOSIS — E7800 Pure hypercholesterolemia, unspecified: Secondary | ICD-10-CM

## 2023-12-19 HISTORY — DX: Nontoxic single thyroid nodule: E04.1

## 2023-12-19 HISTORY — DX: Radiculopathy, lumbar region: M54.16

## 2023-12-19 LAB — CBC
HCT: 43 % (ref 39.0–52.0)
Hemoglobin: 14.6 g/dL (ref 13.0–17.0)
MCH: 31.2 pg (ref 26.0–34.0)
MCHC: 34 g/dL (ref 30.0–36.0)
MCV: 91.9 fL (ref 80.0–100.0)
Platelets: 213 K/uL (ref 150–400)
RBC: 4.68 MIL/uL (ref 4.22–5.81)
RDW: 13.6 % (ref 11.5–15.5)
WBC: 5.6 K/uL (ref 4.0–10.5)
nRBC: 0 % (ref 0.0–0.2)

## 2023-12-19 LAB — BASIC METABOLIC PANEL WITH GFR
Anion gap: 9 (ref 5–15)
BUN: 22 mg/dL (ref 8–23)
CO2: 26 mmol/L (ref 22–32)
Calcium: 9.9 mg/dL (ref 8.9–10.3)
Chloride: 106 mmol/L (ref 98–111)
Creatinine, Ser: 0.69 mg/dL (ref 0.61–1.24)
GFR, Estimated: >60 mL/min (ref >60)
Glucose, Bld: 117 mg/dL — ABNORMAL HIGH (ref 70–99)
Potassium: 3.7 mmol/L (ref 3.5–5.1)
Sodium: 141 mmol/L (ref 135–145)

## 2023-12-19 LAB — CULTURE, URINE COMPREHENSIVE

## 2023-12-19 NOTE — Patient Instructions (Addendum)
 Your procedure is scheduled on: Friday, July 18 Report to the Registration Desk on the 1st floor of the [facility] To find out your arrival time, please call [phone] between 1PM - 3PM on: Thursday, July 17 If your arrival time is 6:00 am, do not arrive before that time as the Medical Mall entrance doors do not open until 6:00 am.  REMEMBER: Instructions that are not followed completely may result in serious medical risk, up to and including death; or upon the discretion of your surgeon and anesthesiologist your surgery may need to be rescheduled.  Do not eat or drink after midnight the night before surgery.  No gum chewing or hard candies.  One week prior to surgery: starting July 11 Stop Anti-inflammatories (NSAIDS) such as Advil, Aleve, Ibuprofen, Motrin, Naproxen, Naprosyn and Aspirin based products such as Excedrin, Goody's Powder, BC Powder. Stop ANY OVER THE COUNTER supplements until after surgery. Stop vitamin D, glucosamine, magnesium, vitamin K, multiple vitamins.  You may however, continue to take Tylenol  if needed for pain up until the day of surgery.  Continue taking all of your other prescription medications up until the day of surgery.  ON THE DAY OF SURGERY ONLY TAKE THESE MEDICATIONS WITH SIPS OF WATER:  Tramadol  only if needed for pain  No Alcohol for 24 hours before or after surgery.  No Smoking including e-cigarettes for 24 hours before surgery.  No chewable tobacco products for at least 6 hours before surgery.  No nicotine patches on the day of surgery.  Do not use any recreational drugs for at least a week (preferably 2 weeks) before your surgery.  Please be advised that the combination of cocaine and anesthesia may have negative outcomes, up to and including death. If you test positive for cocaine, your surgery will be cancelled.  On the morning of surgery brush your teeth with toothpaste and water, you may rinse your mouth with mouthwash if you  wish. Do not swallow any toothpaste or mouthwash.  Do not wear jewelry, make-up, hairpins, clips or nail polish.  For welded (permanent) jewelry: bracelets, anklets, waist bands, etc.  Please have this removed prior to surgery.  If it is not removed, there is a chance that hospital personnel will need to cut it off on the day of surgery.  Do not wear lotions, powders, or perfumes.   Do not shave body hair from the neck down 48 hours before surgery.  Contact lenses, hearing aids and dentures may not be worn into surgery.  Do not bring valuables to the hospital. [facility] is not responsible for any missing/lost belongings or valuables.   Notify your doctor if there is any change in your medical condition (cold, fever, infection).  Wear comfortable clothing (specific to your surgery type) to the hospital.  After surgery, you can help prevent lung complications by doing breathing exercises.  Take deep breaths and cough every 1-2 hours.   If you are being discharged the day of surgery, you will not be allowed to drive home. You will need a responsible individual to drive you home and stay with you for 24 hours after surgery.   If you are taking public transportation, you will need to have a responsible individual with you.  Please call the Pre-admissions Testing Dept. at [phone] if you have any questions about these instructions.  Surgery Visitation Policy:  Patients having surgery or a procedure may have two visitors.  Children under the age of 57 must have an  adult with them who is not the patient.  Merchandiser, retail to address health-related social needs:  [email or web address]

## 2023-12-26 MED ORDER — LACTATED RINGERS IV SOLN: INTRAVENOUS | Status: DC

## 2023-12-26 MED ORDER — ORAL CARE MOUTH RINSE: 15.0000 mL | Freq: Once | OROMUCOSAL | Status: AC

## 2023-12-26 MED ORDER — CEFAZOLIN SODIUM-DEXTROSE 2-4 GM/100ML-% IV SOLN
2.0000 g | INTRAVENOUS | Status: AC
  Administered 2023-12-27: 2 g via INTRAVENOUS

## 2023-12-26 MED ORDER — CHLORHEXIDINE GLUCONATE 0.12 % MT SOLN
15.0000 mL | Freq: Once | OROMUCOSAL | Status: AC
  Administered 2023-12-27: 15 mL via OROMUCOSAL

## 2023-12-27 ENCOUNTER — Ambulatory Visit
Admission: RE | Admit: 2023-12-27 | Discharge: 2023-12-27 | Disposition: A | Discharge status: Home / Self Care | Attending: Urology | Admitting: Urology

## 2023-12-27 ENCOUNTER — Ambulatory Visit: Admitting: Anesthesiology

## 2023-12-27 ENCOUNTER — Encounter
Admission: RE | Disposition: A | Discharge status: Home / Self Care | Payer: Self-pay | Source: Home / Self Care | Attending: Urology

## 2023-12-27 ENCOUNTER — Encounter: Payer: Self-pay | Admitting: Urology

## 2023-12-27 ENCOUNTER — Other Ambulatory Visit: Payer: Self-pay

## 2023-12-27 ENCOUNTER — Ambulatory Visit: Payer: Self-pay | Admitting: Urgent Care

## 2023-12-27 DIAGNOSIS — N138 Other obstructive and reflux uropathy: Secondary | ICD-10-CM | POA: Diagnosis present

## 2023-12-27 DIAGNOSIS — N401 Enlarged prostate with lower urinary tract symptoms: Secondary | ICD-10-CM | POA: Diagnosis present

## 2023-12-27 HISTORY — PX: HOLEP-LASER ENUCLEATION OF THE PROSTATE WITH MORCELLATION: SHX6641

## 2023-12-27 SURGERY — ENUCLEATION, PROSTATE, USING LASER, WITH MORCELLATION
Anesthesia: General

## 2023-12-27 MED ORDER — ONDANSETRON HCL 4 MG/2ML IJ SOLN: 4.0000 mg | Freq: Once | INTRAMUSCULAR | Status: DC | PRN

## 2023-12-27 MED ORDER — FENTANYL CITRATE (PF) 100 MCG/2ML IJ SOLN
INTRAMUSCULAR | Status: AC
  Filled 2023-12-27: qty 2

## 2023-12-27 MED ORDER — OXYCODONE HCL 5 MG PO TABS: 5.0000 mg | ORAL_TABLET | Freq: Once | ORAL | Status: DC | PRN

## 2023-12-27 MED ORDER — ROCURONIUM BROMIDE 100 MG/10ML IV SOLN
INTRAVENOUS | Status: DC | PRN
  Administered 2023-12-27: 10 mg via INTRAVENOUS
  Administered 2023-12-27: 40 mg via INTRAVENOUS
  Administered 2023-12-27: 10 mg via INTRAVENOUS

## 2023-12-27 MED ORDER — PROPOFOL 1000 MG/100ML IV EMUL
INTRAVENOUS | Status: AC
  Filled 2023-12-27: qty 100

## 2023-12-27 MED ORDER — PHENYLEPHRINE 80 MCG/ML (10ML) SYRINGE FOR IV PUSH (FOR BLOOD PRESSURE SUPPORT)
PREFILLED_SYRINGE | INTRAVENOUS | Status: DC | PRN
  Administered 2023-12-27 (×2): 80 ug via INTRAVENOUS

## 2023-12-27 MED ORDER — ACETAMINOPHEN 10 MG/ML IV SOLN
INTRAVENOUS | Status: AC
  Filled 2023-12-27: qty 100

## 2023-12-27 MED ORDER — ACETAMINOPHEN 10 MG/ML IV SOLN
INTRAVENOUS | Status: DC | PRN
  Administered 2023-12-27: 1000 mg via INTRAVENOUS

## 2023-12-27 MED ORDER — MIDAZOLAM HCL 2 MG/2ML IJ SOLN
INTRAMUSCULAR | Status: AC
  Filled 2023-12-27: qty 2

## 2023-12-27 MED ORDER — FENTANYL CITRATE (PF) 100 MCG/2ML IJ SOLN
INTRAMUSCULAR | Status: DC | PRN
  Administered 2023-12-27 (×2): 50 ug via INTRAVENOUS

## 2023-12-27 MED ORDER — OXYCODONE HCL 5 MG/5ML PO SOLN: 5.0000 mg | Freq: Once | ORAL | Status: DC | PRN

## 2023-12-27 MED ORDER — SUGAMMADEX SODIUM 200 MG/2ML IV SOLN
INTRAVENOUS | Status: DC | PRN
  Administered 2023-12-27: 200 mg via INTRAVENOUS

## 2023-12-27 MED ORDER — DEXAMETHASONE SODIUM PHOSPHATE 10 MG/ML IJ SOLN
INTRAMUSCULAR | Status: DC | PRN
  Administered 2023-12-27: 10 mg via INTRAVENOUS

## 2023-12-27 MED ORDER — DEXMEDETOMIDINE HCL IN NACL 200 MCG/50ML IV SOLN
INTRAVENOUS | Status: DC | PRN
  Administered 2023-12-27: 12 ug via INTRAVENOUS

## 2023-12-27 MED ORDER — SUCCINYLCHOLINE CHLORIDE 200 MG/10ML IV SOSY
PREFILLED_SYRINGE | INTRAVENOUS | Status: DC | PRN
  Administered 2023-12-27: 100 mg via INTRAVENOUS

## 2023-12-27 MED ORDER — LIDOCAINE HCL (CARDIAC) PF 100 MG/5ML IV SOSY
PREFILLED_SYRINGE | INTRAVENOUS | Status: DC | PRN
  Administered 2023-12-27: 100 mg via INTRAVENOUS

## 2023-12-27 MED ORDER — MIDAZOLAM HCL 2 MG/2ML IJ SOLN
INTRAMUSCULAR | Status: DC | PRN
  Administered 2023-12-27: 2 mg via INTRAVENOUS

## 2023-12-27 MED ORDER — PROPOFOL 10 MG/ML IV BOLUS
INTRAVENOUS | Status: DC | PRN
  Administered 2023-12-27: 200 mg via INTRAVENOUS

## 2023-12-27 MED ORDER — ONDANSETRON HCL 4 MG/2ML IJ SOLN
INTRAMUSCULAR | Status: DC | PRN
  Administered 2023-12-27: 4 mg via INTRAVENOUS

## 2023-12-27 MED ORDER — CEFAZOLIN SODIUM-DEXTROSE 2-4 GM/100ML-% IV SOLN
INTRAVENOUS | Status: AC
  Filled 2023-12-27: qty 100

## 2023-12-27 MED ORDER — SODIUM CHLORIDE 0.9 % IR SOLN
Status: DC | PRN
  Administered 2023-12-27: 12000 mL
  Administered 2023-12-27: 3000 mL

## 2023-12-27 MED ORDER — GLYCOPYRROLATE 0.2 MG/ML IJ SOLN
INTRAMUSCULAR | Status: DC | PRN
  Administered 2023-12-27: .2 mg via INTRAVENOUS

## 2023-12-27 MED ORDER — FENTANYL CITRATE (PF) 100 MCG/2ML IJ SOLN: 25.0000 ug | INTRAMUSCULAR | Status: DC | PRN

## 2023-12-27 MED ORDER — CHLORHEXIDINE GLUCONATE 0.12 % MT SOLN
OROMUCOSAL | Status: AC
  Filled 2023-12-27: qty 15

## 2023-12-27 SURGICAL SUPPLY — 24 items
ADAPTER IRRIG TUBE 2 SPIKE SOL (ADAPTER) ×4 IMPLANT
BAG URO DRAIN 4000ML (MISCELLANEOUS) ×2 IMPLANT
CATH URETL OPEN END 4X70 (CATHETERS) ×2 IMPLANT
CATH URTH STD 24FR FL 3W 2 (CATHETERS) ×2 IMPLANT
CONTAINER COLLECT MORCELLATR (MISCELLANEOUS) ×2 IMPLANT
DRAPE UTILITY 15X26 TOWEL STRL (DRAPES) IMPLANT
FIBER LASER MOSES 550 DFL (Laser) ×2 IMPLANT
FILTER OVERFLOW MORCELLATOR (FILTER) ×2 IMPLANT
GLOVE BIOGEL PI IND STRL 7.5 (GLOVE) ×2 IMPLANT
GOWN STRL REUS W/ TWL LRG LVL3 (GOWN DISPOSABLE) ×2 IMPLANT
GOWN STRL REUS W/ TWL XL LVL3 (GOWN DISPOSABLE) ×2 IMPLANT
HOLDER FOLEY CATH W/STRAP (MISCELLANEOUS) ×2 IMPLANT
KIT TURNOVER CYSTO (KITS) ×2 IMPLANT
MEMBRANE SLNG YLW 17 FOR INST (MISCELLANEOUS) ×2 IMPLANT
MORCELLATOR ROTATION 4.75 335 (MISCELLANEOUS) ×2 IMPLANT
PACK CYSTO AR (MISCELLANEOUS) ×2 IMPLANT
SET CYSTO W/LG BORE CLAMP LF (SET/KITS/TRAYS/PACK) ×2 IMPLANT
SET IRRIG Y TYPE TUR BLADDER L (SET/KITS/TRAYS/PACK) ×2 IMPLANT
SLEEVE PROTECTION STRL DISP (MISCELLANEOUS) ×4 IMPLANT
SOL .9 NS 3000ML IRR UROMATIC (IV SOLUTION) ×10 IMPLANT
SURGILUBE 2OZ TUBE FLIPTOP (MISCELLANEOUS) ×2 IMPLANT
SYRINGE TOOMEY IRRIG 70ML (MISCELLANEOUS) ×2 IMPLANT
TUBE PUMP MORCELLATOR PIRANHA (TUBING) ×2 IMPLANT
WATER STERILE IRR 1000ML POUR (IV SOLUTION) ×2 IMPLANT

## 2023-12-27 NOTE — Anesthesia Procedure Notes (Signed)
 Procedure Name: Intubation Date/Time: 12/27/2023 7:33 AM  Performed by: Ledora Duncan, CRNAPre-anesthesia Checklist: Patient identified, Emergency Drugs available, Suction available and Patient being monitored Patient Re-evaluated:Patient Re-evaluated prior to induction Oxygen Delivery Method: Circle system utilized Preoxygenation: Pre-oxygenation with 100% oxygen Induction Type: IV induction Ventilation: Mask ventilation without difficulty Laryngoscope Size: McGrath and 3 Grade View: Grade I Tube type: Oral Tube size: 7.0 mm Number of attempts: 1 Airway Equipment and Method: Stylet Placement Confirmation: ETT inserted through vocal cords under direct vision, positive ETCO2 and breath sounds checked- equal and bilateral Secured at: 21 cm Tube secured with: Tape Dental Injury: Teeth and Oropharynx as per pre-operative assessment
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
## 2023-12-27 NOTE — Anesthesia Postprocedure Evaluation (Signed)
 Anesthesia Post Note  Patient: Mason Irwin  Procedure(s) Performed: ENUCLEATION, PROSTATE, USING LASER, WITH MORCELLATION  Patient location during evaluation: PACU Anesthesia Type: General Level of consciousness: awake Pain management: satisfactory to patient Vital Signs Assessment: post-procedure vital signs reviewed and stable Respiratory status: nonlabored ventilation Cardiovascular status: blood pressure returned to baseline Anesthetic complications: no   No notable events documented.   Last Vitals:  Vitals:   12/27/23 0910 12/27/23 0915  BP:  122/82  Pulse: 63 64  Resp: 15 14  Temp:    SpO2: 100% 99%    Last Pain:  Vitals:   12/27/23 0915  TempSrc:   PainSc: 0-No pain                 VAN STAVEREN,Johny Pitstick
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
## 2023-12-27 NOTE — Progress Notes (Signed)
 Patient states he feel as though he needs to have a bowel movement. After sitting in the bathroom for about 15 minutes he was unable to have a bowel movement. Education provided that he has been given anesthesia and it may require time before his body can being motility to have a bowel movement. It was suggested that he give the body time. He and his wife verbalized understanding.

## 2023-12-27 NOTE — Transfer of Care (Signed)
 Immediate Anesthesia Transfer of Care Note  Patient: Mason Irwin  Procedure(s) Performed: ENUCLEATION, PROSTATE, USING LASER, WITH MORCELLATION  Patient Location: PACU  Anesthesia Type:General  Level of Consciousness: drowsy and patient cooperative  Airway & Oxygen Therapy: Patient Spontanous Breathing and Patient connected to face mask oxygen  Post-op Assessment: Report given to RN and Post -op Vital signs reviewed and stable  Post vital signs: Reviewed and stable  Last Vitals:  Vitals Value Taken Time  BP 164/69 12/27/23 08:52  Temp    Pulse 69 12/27/23 08:55  Resp 14 12/27/23 08:55  SpO2 100 % 12/27/23 08:55  Vitals shown include unfiled device data.  Last Pain:  Vitals:   12/27/23 0633  TempSrc: Temporal  PainSc: 0-No pain      Patients Stated Pain Goal: 0 (12/27/23 9366)  Complications: No notable events documented.

## 2023-12-27 NOTE — Op Note (Signed)
 Date of procedure: 12/27/23  Preoperative diagnosis:  BPH with obstruction  Postoperative diagnosis:  Same  Procedure: HoLEP (Holmium Laser Enucleation of the Prostate)  Surgeon: Redell Burnet, MD  Anesthesia: General  Complications: None  Intraoperative findings:  Moderate size prostate with lateral lobe hypertrophy and broad-based median lobe with elevated bladder neck, normal bladder with mild trabeculations Uncomplicated HOLEP, ureteral orifices and verumontanum intact at conclusion of case  EBL: Minimal  Specimens: Stay chips  Enucleation time: 21 minutes  Morcellation time: 23 minutes  Intra-op weight: 54g  Drains: 24 French three-way, 60 cc in balloon  Indication: Mason Irwin is a 68 y.o. patient with BPH with obstructive symptoms who opted for definitive management with HOLEP.  After reviewing the management options for treatment, they elected to proceed with the above surgical procedure(s). We have discussed the potential benefits and risks of the procedure, side effects of the proposed treatment, the likelihood of the patient achieving the goals of the procedure, and any potential problems that might occur during the procedure or recuperation.  We specifically discussed the risks of bleeding, infection, hematuria and clot retention, need for additional procedures, possible overnight hospital stay, temporary urgency and incontinence, rare long-term incontinence, and retrograde ejaculation.  Informed consent has been obtained.   Description of procedure:  The patient was taken to the operating room and general anesthesia was induced.  The patient was placed in the dorsal lithotomy position, prepped and draped in the usual sterile fashion, and preoperative antibiotics(Ancef ) were administered.  SCDs were placed for DVT prophylaxis.  A preoperative time-out was performed.   [name] sounds were used to gently dilated the urethra up to 36F. The 17 French continuous  flow resectoscope was inserted into the urethra using the visual obturator  The prostate was large with obstructing lateral lobes, elevated bladder neck, and broad-based median lobe. The bladder was thoroughly inspected and notable for mild to moderate trabeculations but no suspicious lesions.  The ureteral orifices were located in orthotopic position.    The laser was set to 2 J and 60 Hz and early apical release was performed by making a circumferential mucosal incision proximal to the sphincter.  A lambda incision was then made proximal to the verumontanum.  The prostate was enucleated en bloc circumferentially into the bladder.  The capsule was examined and laser was used for meticulous hemostasis.    The 54 French resectoscope was then switched out for the 26 French nephroscope and prostate tissue was morcellated(Piranha) and the tissue sent to pathology.  A 24 French three-way catheter was inserted easily with the aid of a catheter guide, and 60 cc were placed in the balloon.  Urine was pink.  The catheter irrigated easily with a Toomey syringe.  CBI was initiated.   The patient tolerated the procedure well without any immediate complications and was extubated and transferred to the recovery room in stable condition.  Urine was clear on fast CBI.  Disposition: Stable to PACU  Plan: Wean CBI in PACU, anticipate discharge home today with Foley removal in clinic in 2-3 days  Redell Burnet, MD 12/27/2023

## 2023-12-30 ENCOUNTER — Encounter: Payer: Self-pay | Admitting: Physician Assistant

## 2023-12-30 ENCOUNTER — Ambulatory Visit (INDEPENDENT_AMBULATORY_CARE_PROVIDER_SITE_OTHER): Admitting: Physician Assistant

## 2023-12-30 VITALS — BP 127/86 | HR 71 | Temp 97.9°F | Ht 69.0 in | Wt 155.0 lb

## 2023-12-30 DIAGNOSIS — N138 Other obstructive and reflux uropathy: Secondary | ICD-10-CM

## 2023-12-30 DIAGNOSIS — N401 Enlarged prostate with lower urinary tract symptoms: Secondary | ICD-10-CM

## 2023-12-30 MED ORDER — SULFAMETHOXAZOLE-TRIMETHOPRIM 800-160 MG PO TABS: 1.0000 | ORAL_TABLET | Freq: Two times a day (BID) | ORAL | 0 refills | Status: AC

## 2023-12-30 MED ORDER — SULFAMETHOXAZOLE-TRIMETHOPRIM 800-160 MG PO TABS
1.0000 | ORAL_TABLET | Freq: Once | ORAL | Status: AC
  Administered 2023-12-30: 1 via ORAL

## 2023-12-30 NOTE — Progress Notes (Signed)
 Catheter Removal  Patient is present today for a catheter removal.  58ml of water was drained from the balloon. A 24FR three-way foley cath was removed from the bladder, no complications were noted. Patient tolerated well.  Performed by: Wynema Garoutte, PA-C   Additional notes: Counseled patient on normal postoperative findings including dysuria, gross hematuria, and urinary urgency/leakage. Counseled patient to begin Kegel exercises 3x10 sets daily to increase urinary control and wear absorbent products as needed for security. Written and verbal resources provided today. Surgical pathology pending; will defer to Dr. Francisca to share results when available.  Notably, he reports fever, Tmax 101.6 F, and chills last night.  He is feeling better this morning and triage vitals are WNL as below; he has not taken any medication this morning.  He denies chest pain, shortness of breath, palpitations, leg swelling, or pain.  I noticed him cough, but he attributes this to irritation from his ET tube. Will cover for possible UTI with Bactrim  DS BID x7 days; 1 dose administered prior to Foley removal today. We discussed return precautions including malaise or recurrent fever.  Vitals:   12/30/23 0924  BP: 127/86  Pulse: 71  Temp: 97.9 F (36.6 C)     Follow up: Return in about 3 months (around 03/31/2024) for Postop f/u with Dr. Francisca.

## 2023-12-30 NOTE — Patient Instructions (Signed)

## 2024-01-01 ENCOUNTER — Ambulatory Visit: Payer: Self-pay | Admitting: Urology

## 2024-01-01 LAB — SURGICAL PATHOLOGY

## 2024-01-09 ENCOUNTER — Ambulatory Visit (INDEPENDENT_AMBULATORY_CARE_PROVIDER_SITE_OTHER): Admitting: Podiatry

## 2024-01-09 ENCOUNTER — Encounter: Payer: Self-pay | Admitting: Podiatry

## 2024-01-09 DIAGNOSIS — Z79899 Other long term (current) drug therapy: Secondary | ICD-10-CM

## 2024-01-09 DIAGNOSIS — L600 Ingrowing nail: Secondary | ICD-10-CM | POA: Diagnosis not present

## 2024-01-09 MED ORDER — TERBINAFINE HCL 250 MG PO TABS: 250.0000 mg | ORAL_TABLET | Freq: Every day | ORAL | 0 refills | Status: DC

## 2024-01-09 NOTE — Progress Notes (Signed)
 He presents today chief complaint of an ingrown toenail to the hallux left.  He is also here for follow-up of his nail pathology.  States that his ingrown toenails been bothering him now for the past few weeks and he is ready to have the margins removed.  Objective: Vital signs are stable alert oriented x 3.  Pulses are palpable.  Nail pathology does demonstrate yeast infection.  His hallux nail does demonstrate ingrown margins to the tibial-fibular border of the hallux left with tenderness and mild erythema no purulence no malodor.  Assessment: Onychomycosis yeast infection.  Ingrown toenail hallux left foot.  Plan: Discussed etiology pathology conservative surgical therapies at this point in time perform chemical matricectomy to the tibial-fibular border of the hallux left.  Tolerated procedure well without complications was given both oral and home-going structure for care and soaking the toe he is going to use his wife's Cortisporin otic which she already has at home.  We did discuss pros and cons of oral Lamisil  today we were going to go ahead and get him started on that he will take 30 tablets over the next 30 days 250 mg each.  In 1 month we will reevaluate the matrixectomy as well as perform a comprehensive metabolic panel and refill the Lamisil .

## 2024-01-09 NOTE — Patient Instructions (Signed)

## 2024-01-13 ENCOUNTER — Other Ambulatory Visit: Payer: Self-pay

## 2024-01-13 ENCOUNTER — Other Ambulatory Visit

## 2024-01-13 DIAGNOSIS — N138 Other obstructive and reflux uropathy: Secondary | ICD-10-CM

## 2024-01-13 DIAGNOSIS — R300 Dysuria: Secondary | ICD-10-CM

## 2024-01-13 LAB — URINALYSIS, COMPLETE
Bilirubin, UA: NEGATIVE
Glucose, UA: NEGATIVE
Ketones, UA: NEGATIVE
Nitrite, UA: NEGATIVE
Specific Gravity, UA: <1.005 — ABNORMAL LOW (ref 1.005–1.030)
Urobilinogen, Ur: 0.2 mg/dL (ref 0.2–1.0)
pH, UA: 6 (ref 5.0–7.5)

## 2024-01-13 LAB — MICROSCOPIC EXAMINATION
RBC, Urine: >30 /HPF — AB (ref 0–2)
WBC, UA: >30 /HPF — AB (ref 0–5)

## 2024-01-13 NOTE — Telephone Encounter (Signed)
 Patient also lvm on appt line requesting to drop off urine sample. Please advise patient.

## 2024-01-15 ENCOUNTER — Ambulatory Visit: Payer: Self-pay | Admitting: Physician Assistant

## 2024-01-16 LAB — CULTURE, URINE COMPREHENSIVE

## 2024-01-23 ENCOUNTER — Other Ambulatory Visit: Payer: Medicare Other

## 2024-01-23 ENCOUNTER — Other Ambulatory Visit: Payer: Self-pay | Admitting: Internal Medicine

## 2024-01-23 ENCOUNTER — Ambulatory Visit (HOSPITAL_BASED_OUTPATIENT_CLINIC_OR_DEPARTMENT_OTHER)
Admission: RE | Admit: 2024-01-23 | Discharge: 2024-01-23 | Disposition: A | Discharge status: Home / Self Care | Source: Ambulatory Visit | Attending: Internal Medicine | Admitting: Internal Medicine

## 2024-01-23 DIAGNOSIS — Z13820 Encounter for screening for osteoporosis: Secondary | ICD-10-CM | POA: Insufficient documentation

## 2024-01-23 DIAGNOSIS — M8589 Other specified disorders of bone density and structure, multiple sites: Secondary | ICD-10-CM | POA: Insufficient documentation

## 2024-01-23 DIAGNOSIS — M8580 Other specified disorders of bone density and structure, unspecified site: Secondary | ICD-10-CM

## 2024-01-23 DIAGNOSIS — M8588 Other specified disorders of bone density and structure, other site: Secondary | ICD-10-CM

## 2024-01-27 ENCOUNTER — Ambulatory Visit: Payer: Self-pay | Admitting: Internal Medicine

## 2024-01-29 ENCOUNTER — Telehealth: Payer: Self-pay | Admitting: Physical Medicine and Rehabilitation

## 2024-01-29 NOTE — Telephone Encounter (Signed)
Patient would like an appointment with Dr. Newton.  

## 2024-01-30 ENCOUNTER — Other Ambulatory Visit: Payer: Self-pay | Admitting: Physical Medicine and Rehabilitation

## 2024-01-30 DIAGNOSIS — M5416 Radiculopathy, lumbar region: Secondary | ICD-10-CM

## 2024-02-06 ENCOUNTER — Encounter: Payer: Self-pay | Admitting: Podiatry

## 2024-02-06 ENCOUNTER — Ambulatory Visit (INDEPENDENT_AMBULATORY_CARE_PROVIDER_SITE_OTHER): Admitting: Podiatry

## 2024-02-06 DIAGNOSIS — E7800 Pure hypercholesterolemia, unspecified: Secondary | ICD-10-CM | POA: Insufficient documentation

## 2024-02-06 DIAGNOSIS — Z8042 Family history of malignant neoplasm of prostate: Secondary | ICD-10-CM | POA: Insufficient documentation

## 2024-02-06 DIAGNOSIS — M419 Scoliosis, unspecified: Secondary | ICD-10-CM | POA: Insufficient documentation

## 2024-02-06 DIAGNOSIS — Z79899 Other long term (current) drug therapy: Secondary | ICD-10-CM | POA: Diagnosis not present

## 2024-02-06 DIAGNOSIS — M47816 Spondylosis without myelopathy or radiculopathy, lumbar region: Secondary | ICD-10-CM | POA: Insufficient documentation

## 2024-02-06 DIAGNOSIS — L603 Nail dystrophy: Secondary | ICD-10-CM | POA: Diagnosis not present

## 2024-02-06 DIAGNOSIS — Z8249 Family history of ischemic heart disease and other diseases of the circulatory system: Secondary | ICD-10-CM | POA: Insufficient documentation

## 2024-02-06 DIAGNOSIS — H1045 Other chronic allergic conjunctivitis: Secondary | ICD-10-CM | POA: Insufficient documentation

## 2024-02-06 DIAGNOSIS — J309 Allergic rhinitis, unspecified: Secondary | ICD-10-CM | POA: Insufficient documentation

## 2024-02-06 DIAGNOSIS — J300 Vasomotor rhinitis: Secondary | ICD-10-CM | POA: Insufficient documentation

## 2024-02-06 LAB — COMPREHENSIVE METABOLIC PANEL WITH GFR
AG Ratio: 1.7 (calc) (ref 1.0–2.5)
ALT: 22 U/L (ref 9–46)
AST: 23 U/L (ref 10–35)
Albumin: 4.3 g/dL (ref 3.6–5.1)
Alkaline phosphatase (APISO): 64 U/L (ref 35–144)
BUN/Creatinine Ratio: 30 (calc) — ABNORMAL HIGH (ref 6–22)
BUN: 28 mg/dL — ABNORMAL HIGH (ref 7–25)
CO2: 25 mmol/L (ref 20–32)
Calcium: 9.6 mg/dL (ref 8.6–10.3)
Chloride: 104 mmol/L (ref 98–110)
Creat: 0.93 mg/dL (ref 0.70–1.35)
Globulin: 2.5 g/dL (ref 1.9–3.7)
Glucose, Bld: 92 mg/dL (ref 65–99)
Potassium: 4.3 mmol/L (ref 3.5–5.3)
Sodium: 137 mmol/L (ref 135–146)
Total Bilirubin: 0.4 mg/dL (ref 0.2–1.2)
Total Protein: 6.8 g/dL (ref 6.1–8.1)
eGFR: 91 mL/min/1.73m2 (ref >=60)

## 2024-02-06 MED ORDER — TERBINAFINE HCL 250 MG PO TABS: 250.0000 mg | ORAL_TABLET | Freq: Every day | ORAL | 0 refills | Status: AC

## 2024-02-06 NOTE — Progress Notes (Signed)
 He presents today for follow-up of his nail fungus.  He states that the ingrown is doing really well.  He denies fever chills nausea muscle aches pains calf pain back pain chest pain shortness of breath itching or rashes with the use of the Lamisil .  States he basically did not even know he was taking it.  Objective: Hallux nail left tibia fibular border appear to be healing very nicely status post matrixectomy.  No change in the nail plate is yet yet secondary to the use of Lamisil .  Assessment: Well-healing surgical toe ultratherapy with Lamisil  for onychomycosis.  Plan: Requested a comprehensive metabolic panel which she will have drawn at [facility] labs.  I will notify him once the results come in.  Also started him back on 90 days of Lamisil .  He will take 1 tablet daily for the next 90 days I will follow-up with him in 4 months.  Questions or concerns he will notify us .

## 2024-02-13 ENCOUNTER — Ambulatory Visit: Payer: Self-pay | Admitting: Podiatry

## 2024-02-13 NOTE — Progress Notes (Signed)
 Patient aware

## 2024-02-20 ENCOUNTER — Ambulatory Visit: Admitting: Podiatry

## 2024-02-27 ENCOUNTER — Ambulatory Visit (INDEPENDENT_AMBULATORY_CARE_PROVIDER_SITE_OTHER): Admitting: Physical Medicine and Rehabilitation

## 2024-02-27 ENCOUNTER — Other Ambulatory Visit: Payer: Self-pay

## 2024-02-27 VITALS — BP 120/70 | HR 76

## 2024-02-27 DIAGNOSIS — M5416 Radiculopathy, lumbar region: Secondary | ICD-10-CM

## 2024-02-27 MED ORDER — METHYLPREDNISOLONE ACETATE 80 MG/ML IJ SUSP
40.0000 mg | Freq: Once | INTRAMUSCULAR | Status: AC
  Administered 2024-02-27: 40 mg

## 2024-02-27 NOTE — Progress Notes (Signed)
 Pain Scale   Average Pain 3 Patient advising he has chronic lower back pain  that gets worse when he is working.        +Driver, -BT, -Dye Allergies.

## 2024-03-04 NOTE — Procedures (Signed)
 Lumbar Epidural Steroid Injection - Interlaminar Approach with Fluoroscopic Guidance  Patient: Mason Irwin      Date of Birth: 29-Apr-1956 MRN: [id number] PCP: Kip Righter, MD      Visit Date: 02/27/2024   Universal Protocol:     Consent Given By: the patient  Position: PRONE  Additional Comments: Vital signs were monitored before and after the procedure. Patient was prepped and draped in the usual sterile fashion. The correct patient, procedure, and site was verified.   Injection Procedure Details:   Procedure diagnoses: Lumbar radiculopathy [M54.16]   Meds Administered:  Meds ordered this encounter  Medications   methylPREDNISolone  acetate (DEPO-MEDROL ) injection 40 mg     Laterality: Right  Location/Site:  L3-4  Needle: 3.5 in., 20 ga. Tuohy  Needle Placement: Paramedian epidural  Findings:   -Comments: Excellent flow of contrast into the epidural space.  Procedure Details: Using a paramedian approach from the side mentioned above, the region overlying the inferior lamina was localized under fluoroscopic visualization and the soft tissues overlying this structure were infiltrated with 4 ml. of 1% Lidocaine  without Epinephrine. The Tuohy needle was inserted into the epidural space using a paramedian approach.   The epidural space was localized using loss of resistance along with counter oblique bi-planar fluoroscopic views.  After negative aspirate for air, blood, and CSF, a 2 ml. volume of Isovue-250 was injected into the epidural space and the flow of contrast was observed. Radiographs were obtained for documentation purposes.    The injectate was administered into the level noted above.   Additional Comments:  The patient tolerated the procedure well Dressing: 2 x 2 sterile gauze and Band-Aid    Post-procedure details: Patient was observed during the procedure. Post-procedure instructions were reviewed.  Patient left the clinic in stable condition.

## 2024-03-04 NOTE — Progress Notes (Signed)
 Broedy A Upperman - 68 y.o. male MRN [id number]  Date of birth: [birth year]  Office Visit Note: Visit Date: 02/27/2024 PCP: Kip Righter, MD Referred by: Kip Righter, MD  Subjective: Chief Complaint  Patient presents with   Lower Back - Pain   HPI:  Esau A Walts is a 68 y.o. male who comes in today at the request of Duwaine Pouch, FNP for planned Right L3-4 Lumbar Interlaminar epidural steroid injection with fluoroscopic guidance.  The patient has failed conservative care including home exercise, medications, time and activity modification.  This injection will be diagnostic and hopefully therapeutic.  Please see requesting physician notes for further details and justification.   ROS Otherwise per HPI.  Assessment & Plan: Visit Diagnoses:    ICD-10-CM   1. Lumbar radiculopathy  M54.16 XR C-ARM NO REPORT    Epidural Steroid injection    methylPREDNISolone  acetate (DEPO-MEDROL ) injection 40 mg      Plan: No additional findings.   Meds & Orders:  Meds ordered this encounter  Medications   methylPREDNISolone  acetate (DEPO-MEDROL ) injection 40 mg    Orders Placed This Encounter  Procedures   XR C-ARM NO REPORT   Epidural Steroid injection    Follow-up: Return for visit to requesting provider as needed.   Procedures: No procedures performed  Lumbar Epidural Steroid Injection - Interlaminar Approach with Fluoroscopic Guidance  Patient: Pius A Eckroth      Date of Birth: 09/05/1955 MRN: [id number] PCP: Kip Righter, MD      Visit Date: 02/27/2024   Universal Protocol:     Consent Given By: the patient  Position: PRONE  Additional Comments: Vital signs were monitored before and after the procedure. Patient was prepped and draped in the usual sterile fashion. The correct patient, procedure, and site was verified.   Injection Procedure Details:   Procedure diagnoses: Lumbar radiculopathy [M54.16]   Meds Administered:  Meds ordered this encounter   Medications   methylPREDNISolone  acetate (DEPO-MEDROL ) injection 40 mg     Laterality: Right  Location/Site:  L3-4  Needle: 3.5 in., 20 ga. Tuohy  Needle Placement: Paramedian epidural  Findings:   -Comments: Excellent flow of contrast into the epidural space.  Procedure Details: Using a paramedian approach from the side mentioned above, the region overlying the inferior lamina was localized under fluoroscopic visualization and the soft tissues overlying this structure were infiltrated with 4 ml. of 1% Lidocaine  without Epinephrine. The Tuohy needle was inserted into the epidural space using a paramedian approach.   The epidural space was localized using loss of resistance along with counter oblique bi-planar fluoroscopic views.  After negative aspirate for air, blood, and CSF, a 2 ml. volume of Isovue-250 was injected into the epidural space and the flow of contrast was observed. Radiographs were obtained for documentation purposes.    The injectate was administered into the level noted above.   Additional Comments:  The patient tolerated the procedure well Dressing: 2 x 2 sterile gauze and Band-Aid    Post-procedure details: Patient was observed during the procedure. Post-procedure instructions were reviewed.  Patient left the clinic in stable condition.   Clinical History: Narrative & Impression CLINICAL DATA:  Chronic low back pain without sciatica   EXAM: MRI LUMBAR SPINE WITHOUT CONTRAST   TECHNIQUE: Multiplanar, multisequence MR imaging of the lumbar spine was performed. No intravenous contrast was administered.   COMPARISON:  None Available.   FINDINGS: Segmentation:  Standard.   Alignment:  No static listhesis.  Levocurvature of the  lumbar spine.   Vertebrae: No acute fracture, evidence of discitis, or aggressive bone lesion.   Conus medullaris and cauda equina: Conus extends to the T12-L1 level. Conus and cauda equina appear normal.   Paraspinal  and other soft tissues: No acute paraspinal abnormality.   Disc levels:   Disc spaces: Moderate disc height loss at T11-12, T12-L1, L1-2, L4-5. Mild disc height loss at L2-3 with Modic 1 endplate changes.   T12-L1: Mild disc osteophyte complex. Mild bilateral facet arthropathy. Moderate right foraminal stenosis. No left foraminal stenosis. No central canal stenosis.   L1-L2: Mild disc osteophyte complex. No foraminal or central canal stenosis.   L2-L3: Mild disc osteophyte complex. Moderate bilateral facet arthropathy. Moderate central canal stenosis. Right lateral recess stenosis which may impinge upon the right L3 nerve root. Mild right foraminal stenosis. No left foraminal stenosis.   L3-L4: Mild disc osteophyte complex. Mild bilateral facet arthropathy. Moderate central canal stenosis. Left subarticular recess stenosis which may impinge upon the left L4 nerve root. Moderate left foraminal stenosis. No right foraminal stenosis.   L4-L5: Mild disc bulge. Mild bilateral facet arthropathy. Moderate right and mild-moderate left foraminal stenosis. No central canal stenosis.   L5-S1: No significant disc bulge. No neural foraminal stenosis. No central canal stenosis.   IMPRESSION: 1. Diffuse lumbar spine spondylosis as described above. 2. No acute osseous injury of the lumbar spine.     Electronically Signed   By: Julaine Blanch M.D.   On: 05/21/2023 17:50     Objective:  VS:  HT:    WT:   BMI:     BP:120/70  HR:76bpm  TEMP: ( )  RESP:  Physical Exam Vitals and nursing note reviewed.  Constitutional:      General: He is not in acute distress.    Appearance: Normal appearance. He is not ill-appearing.  HENT:     Head: Normocephalic and atraumatic.     Right Ear: External ear normal.     Left Ear: External ear normal.     Nose: No congestion.  Eyes:     Extraocular Movements: Extraocular movements intact.  Cardiovascular:     Rate and Rhythm: Normal rate.      Pulses: Normal pulses.  Pulmonary:     Effort: Pulmonary effort is normal. No respiratory distress.  Abdominal:     General: There is no distension.     Palpations: Abdomen is soft.  Musculoskeletal:        General: No tenderness or signs of injury.     Cervical back: Neck supple.     Right lower leg: No edema.     Left lower leg: No edema.     Comments: Patient has good distal strength without clonus.  Skin:    Findings: No erythema or rash.  Neurological:     General: No focal deficit present.     Mental Status: He is alert and oriented to person, place, and time.     Sensory: No sensory deficit.     Motor: No weakness or abnormal muscle tone.     Coordination: Coordination normal.  Psychiatric:        Mood and Affect: Mood normal.        Behavior: Behavior normal.      Imaging: No results found.

## 2024-03-12 ENCOUNTER — Ambulatory Visit: Admitting: Internal Medicine

## 2024-03-12 ENCOUNTER — Encounter: Payer: Self-pay | Admitting: Internal Medicine

## 2024-03-12 ENCOUNTER — Other Ambulatory Visit

## 2024-03-12 VITALS — BP 114/70 | HR 68 | Ht 69.0 in | Wt 162.0 lb

## 2024-03-12 DIAGNOSIS — R194 Change in bowel habit: Secondary | ICD-10-CM | POA: Diagnosis not present

## 2024-03-12 DIAGNOSIS — K529 Noninfective gastroenteritis and colitis, unspecified: Secondary | ICD-10-CM

## 2024-03-12 DIAGNOSIS — R350 Frequency of micturition: Secondary | ICD-10-CM | POA: Diagnosis not present

## 2024-03-12 DIAGNOSIS — R351 Nocturia: Secondary | ICD-10-CM

## 2024-03-12 DIAGNOSIS — K638219 Small intestinal bacterial overgrowth, unspecified: Secondary | ICD-10-CM

## 2024-03-12 DIAGNOSIS — M6289 Other specified disorders of muscle: Secondary | ICD-10-CM
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
## 2024-03-12 NOTE — Patient Instructions (Addendum)
 Your provider has requested that you go to the basement level for lab work before leaving today. Press B on the elevator. The lab is located at the first door on the left as you exit the elevator.  Due to recent changes in healthcare laws, you may see the results of your imaging and laboratory studies on MyChart before your provider has had a chance to review them.  We understand that in some cases there may be results that are confusing or concerning to you. Not all laboratory results come back in the same time frame and the provider may be waiting for multiple results in order to interpret others.  Please give us  48 hours in order for your provider to thoroughly review all the results before contacting the office for clarification of your results.   Dr Avram will communicate with your urology Dr about pelvic floor physical therapy.  _______________________________________________________  If your blood pressure at your visit was 140/90 or greater, please contact your primary care physician to follow up on this.  _______________________________________________________  If you are age 66 or older, your body mass index should be between 23-30. Your Body mass index is 23.92 kg/m. If this is out of the aforementioned range listed, please consider follow up with your Primary Care Provider.  If you are age 33 or younger, your body mass index should be between 19-25. Your Body mass index is 23.92 kg/m. If this is out of the aformentioned range listed, please consider follow up with your Primary Care Provider.   ________________________________________________________  The [facility] providers would like to encourage you to use MYCHART to communicate with providers for non-urgent requests or questions.  Due to long hold times on the telephone, sending your provider a message by [facility] may be a faster and more efficient way to get a response.  Please allow 48 business hours for a response.  Please  remember that this is for non-urgent requests.  _______________________________________________________  [facility] is using a team-based approach to care.  Your team is made up of your doctor and two to three APPS. Our APPS (Nurse Practitioners and Physician Assistants) work with your physician to ensure care continuity for you. They are fully qualified to address your health concerns and develop a treatment plan. They communicate directly with your gastroenterologist to care for you. Seeing the Advanced Practice Practitioners on your physician's team can help you by facilitating care more promptly, often allowing for earlier appointments, access to diagnostic testing, procedures, and other specialty referrals.   I appreciate the opportunity to care for you. Lupita Avram, MD, [facility]

## 2024-03-13 ENCOUNTER — Other Ambulatory Visit

## 2024-03-13 LAB — IGA: Immunoglobulin A: 122 mg/dL (ref 70–320)

## 2024-03-13 LAB — TISSUE TRANSGLUTAMINASE, IGA: (tTG) Ab, IgA: <1 U/mL

## 2024-03-17 LAB — CALPROTECTIN, FECAL: Calprotectin, Fecal: 18 ug/g (ref 0–120)

## 2024-03-18 ENCOUNTER — Ambulatory Visit: Payer: Self-pay | Admitting: Internal Medicine

## 2024-03-18 ENCOUNTER — Other Ambulatory Visit: Payer: Self-pay | Admitting: Internal Medicine

## 2024-03-18 DIAGNOSIS — R350 Frequency of micturition: Secondary | ICD-10-CM

## 2024-03-18 DIAGNOSIS — M6289 Other specified disorders of muscle: Secondary | ICD-10-CM

## 2024-03-18 DIAGNOSIS — R351 Nocturia: Secondary | ICD-10-CM

## 2024-03-18 DIAGNOSIS — R194 Change in bowel habit: Secondary | ICD-10-CM

## 2024-04-08 ENCOUNTER — Ambulatory Visit: Admitting: Urology

## 2024-04-08 VITALS — BP 132/81 | HR 58 | Wt 162.0 lb

## 2024-04-08 DIAGNOSIS — N138 Other obstructive and reflux uropathy: Secondary | ICD-10-CM | POA: Diagnosis not present

## 2024-04-08 DIAGNOSIS — N401 Enlarged prostate with lower urinary tract symptoms: Secondary | ICD-10-CM

## 2024-04-08 DIAGNOSIS — R399 Unspecified symptoms and signs involving the genitourinary system: Secondary | ICD-10-CM

## 2024-04-08 LAB — BLADDER SCAN AMB NON-IMAGING

## 2024-04-08 NOTE — Progress Notes (Signed)
   04/08/2024 4:44 PM   Nicolae A Tuch 02-14-1956 [id number]  Reason for visit: Follow up BPH status post HOLEP, pelvic floor dysfunction  History: Referred by Dr. Shane for [facility], primary symptoms were weak stream, straining, nocturia Underwent uncomplicated HOLEP July 2025 with removal 55 g benign tissue  Physical Exam: BP 132/81 (BP Location: Left Arm, Patient Position: Sitting, Cuff Size: Normal)   Pulse (!) 58   Wt 162 lb (73.5 kg)   SpO2 98%   BMI 23.92 kg/m    Today: Overall doing well, urinating with a strong stream, nocturia has improved.  Mild urge incontinence improving, mild dysuria improving.  Seen by GI and felt to have pelvic floor dysfunction and I agree with those findings, has an appointment with pelvic floor physical therapy next month PVR today normal at 2ml  Plan:   LUTS/pelvic floor dysfunction: Emptying well after HOLEP, agree with pelvic floor physical therapy, avoid bladder irritants.  Consider OAB medication at follow-up if no significant improvement in urgency RTC 6 months IPSS, PVR, sooner if problems.  He prefers follow-up here over [facility] based on proximity.   Redell JAYSON Burnet, MD  [facility] [address] [place], [place] [address] [phone]

## 2024-04-08 NOTE — Patient Instructions (Signed)
 Pelvic Floor Dysfunction, Male     Pelvic floor dysfunction (PFD) is a condition that results when the group of muscles and connective tissues that support the organs in the pelvis (pelvic floor muscles) do not work well. These muscles and their connections form a sling that supports the colon and bladder. In men, these muscles also support the prostate gland. PFD causes pelvic floor muscles to be too weak, too tight, or both. In PFD, muscle movements are not coordinated. This may cause bowel or bladder problems. It may also cause pain. What are the causes? This condition may be caused by an injury to the pelvic area or by a weakening of pelvic muscles. In many cases, the exact cause is not known. What increases the risk? The following factors may make you more likely to develop PFD: Having chronic bladder tissue inflammation (interstitial cystitis). Being an older person. Being overweight. History of radiation treatment for cancer in the pelvic region. Previous pelvic surgery, such as removal of the prostate gland (prostatectomy). What are the signs or symptoms? Symptoms of this condition vary and may include: Bladder symptoms, such as: Trouble starting urination and emptying the bladder. Frequent urinary tract infections. Leaking urine when coughing, laughing, or exercising (stress incontinence). Having to pass urine urgently or frequently. Pain when passing urine. Bowel symptoms, such as: Constipation. Urgent or frequent bowel movements. Incomplete bowel movements. Painful bowel movements. Leaking stool or gas. Unexplained genital or rectal pain. Genital or rectal muscle spasms. Low back pain. Sexual dysfunction, such as erectile dysfunction, premature ejaculation, or pain during or after sexual activity. How is this diagnosed? This condition is diagnosed based on: Your symptoms and medical history. A physical exam. During the exam, your health care provider may check your  pelvic muscles for tightness, spasm, pain, or weakness. This may include a rectal exam. In some cases, you may have diagnostic tests, such as: Electrical muscle function tests. Urine flow testing. X-ray tests of bowel function. Ultrasound of the pelvic organs. How is this treated? Treatment for this condition depends on your symptoms. Treatment options include: Physical therapy. This may include Kegel exercises to help relax or strengthen the pelvic floor muscles. Biofeedback. This type of therapy provides feedback on how tight your pelvic floor muscles are so that you can learn to control them. Massage therapy. A treatment that involves electrical stimulation of the pelvic floor muscles to help control pain (transcutaneous electrical nerve stimulation, or TENS). Sound wave therapy (ultrasound) to reduce muscle spasms. Medicines, such as: Muscle relaxants. Bladder control medicines. Surgery to reconstruct or support pelvic floor muscles may be an option if other treatments do not help. Follow these instructions at home: Activity Do your usual activities as told by your health care provider. Ask your health care provider if you should modify any activities. Do pelvic floor strengthening or relaxing exercises at home as told by your physical therapist. Lifestyle Maintain a healthy weight. Eat foods that are high in fiber, such as beans, whole grains, and fresh fruits and vegetables. Limit foods that are high in fat and processed sugars, such as fried or sweet foods. Manage stress with relaxation techniques such as yoga or meditation. General instructions If you have problems with leakage: Use absorbable pads or wear padded underwear. Wash your genital and anal area frequently with mild soap. Keep your genital and anal area as clean and dry as possible. Ask your health care provider if you should try a barrier cream to prevent skin irritation. Take warm baths  to relieve pelvic muscle  tension or spasms. Take over-the-counter and prescription medicines only as told by your health care provider. Keep all follow-up visits. How is this prevented? The cause of PFD is not always known, but there are a few things you can do to reduce the risk of developing this condition, including: Staying at a healthy weight. Getting regular exercise. Managing stress. Contact a health care provider if: Your symptoms are not improving with home care. You have signs or symptoms of PFD that get worse. You develop new signs or symptoms. You have signs of a urinary tract infection, such as: Fever. Chills. Increased urinary frequency. A burning feeling when urinating. You have not had a bowel movement in 3 days (constipation). Summary Pelvic floor dysfunction results when the muscles and connective tissues in your pelvic floor do not work well. These muscles and their connections form a sling that supports your colon and bladder. In men, these muscles also support the prostate gland. PFD may be caused by an injury to the pelvic area or by a weakening of pelvic muscles. PFD causes pelvic floor muscles to be too weak, too tight, or a combination of both. Symptoms may vary from person to person. In most cases, PFD can be treated with physical therapies and medicines. Surgery may be an option if other treatments do not help. This information is not intended to replace advice given to you by your health care provider. Make sure you discuss any questions you have with your health care provider. Nocturia refers to the need to wake up during the night to urinate, which can disrupt your sleep and impact your overall well-being. Fortunately, there are several strategies you can employ to help prevent or manage nocturia. It's important to consult with your healthcare provider before making any significant changes to your routine. Here are some helpful strategies to consider:  Limit Fluid Intake Before  Bed: Avoid drinking large amounts of fluids in the evening, especially within a few hours of bedtime. Consume most of your daily fluid intake earlier in the day to reduce the need to urinate at night.  Monitor Your Diet: Limit your intake of caffeine and alcohol, as these substances can increase urine production and irritate the bladder.  Avoid diet, zero calorie, and artificially sweetened drinks, especially sodas, in the afternoon or evening. Be mindful of consuming foods and drinks with high water content before bedtime, such as watermelon and herbal teas.  Time Your Medications: If you're taking medications that contribute to increased urination, consult your healthcare provider about adjusting the timing of these medications to minimize their impact during the night.  Practice Double Voiding: Before going to bed, make an effort to empty your bladder twice within a short period. This can help reduce the amount of urine left in your bladder before sleep.  Bladder Training: Gradually increase the time between bathroom visits during the day to train your bladder to hold larger volumes of urine. Over time, this can help reduce the frequency of nighttime awakenings to urinate.  Elevate Your Legs During the Day: Elevating your legs during the day can help minimize fluid retention in your lower extremities, which might reduce nighttime urination.  Pelvic Floor Exercises: Strengthening your pelvic floor muscles through Kegel exercises can help improve bladder control and potentially reduce the urge to urinate at night.  Create a Relaxing Bedtime Routine: Stress and anxiety can exacerbate nocturia. Engage in calming activities before bed, such as reading, listening to soothing music, or  practicing relaxation techniques.  Stay Active: Engage in regular physical activity, but avoid intense exercise close to bedtime, as this can increase your body's demand for fluids.  Maintain a Healthy  Weight: Excess weight can compress the bladder and contribute to bladder and urinary issues. Aim to achieve and maintain a healthy weight through a balanced diet and regular exercise.  Remember that every individual is unique, and the effectiveness of these strategies may vary. It's important to work with your healthcare provider to develop a plan that suits your specific needs and addresses any underlying causes of nocturia.

## 2024-04-13 ENCOUNTER — Encounter: Payer: Self-pay | Admitting: Radiology

## 2024-05-06 ENCOUNTER — Ambulatory Visit: Attending: Internal Medicine | Admitting: Physical Therapy

## 2024-05-06 ENCOUNTER — Other Ambulatory Visit: Payer: Self-pay

## 2024-05-06 DIAGNOSIS — R350 Frequency of micturition: Secondary | ICD-10-CM | POA: Insufficient documentation

## 2024-05-06 DIAGNOSIS — R194 Change in bowel habit: Secondary | ICD-10-CM | POA: Insufficient documentation

## 2024-05-06 DIAGNOSIS — R293 Abnormal posture: Secondary | ICD-10-CM | POA: Insufficient documentation

## 2024-05-06 DIAGNOSIS — M6281 Muscle weakness (generalized): Secondary | ICD-10-CM | POA: Diagnosis present

## 2024-05-06 DIAGNOSIS — M6289 Other specified disorders of muscle: Secondary | ICD-10-CM | POA: Diagnosis not present

## 2024-05-06 DIAGNOSIS — R351 Nocturia: Secondary | ICD-10-CM | POA: Diagnosis not present

## 2024-05-06 DIAGNOSIS — R279 Unspecified lack of coordination: Secondary | ICD-10-CM | POA: Diagnosis present
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
## 2024-05-06 NOTE — Patient Instructions (Signed)

## 2024-05-26 ENCOUNTER — Ambulatory Visit: Admitting: Podiatry

## 2024-05-26 DIAGNOSIS — Z79899 Other long term (current) drug therapy: Secondary | ICD-10-CM

## 2024-05-26 DIAGNOSIS — L603 Nail dystrophy: Secondary | ICD-10-CM | POA: Diagnosis not present

## 2024-05-26 MED ORDER — TERBINAFINE HCL 250 MG PO TABS: 250.0000 mg | ORAL_TABLET | Freq: Every day | ORAL | 1 refills | Status: AC

## 2024-05-26 NOTE — Progress Notes (Signed)
 He presents today for follow-up of his Lamisil  therapy he states his left toes looking great the right toe is not growing much at all but she did say there was a nail dystrophy.  Objective: Pulses are palpable.  Outgrowth is approximately 75% on the hallux nail left to the right 1 the color appears to be lighter but it does not appear to have much growth.  Assessment: Well-healing onychomycosis left hallux right hallux slow with dystrophy.  Plan: I encouraged him to do another 30 days of Lamisil  with refill and I will see him in 3 months

## 2024-06-24 ENCOUNTER — Ambulatory Visit: Attending: Internal Medicine | Admitting: Physical Therapy

## 2024-06-24 ENCOUNTER — Encounter: Payer: Self-pay | Admitting: Physical Medicine and Rehabilitation

## 2024-06-24 DIAGNOSIS — R293 Abnormal posture: Secondary | ICD-10-CM | POA: Diagnosis present

## 2024-06-24 DIAGNOSIS — M6281 Muscle weakness (generalized): Secondary | ICD-10-CM | POA: Diagnosis present

## 2024-06-24 DIAGNOSIS — R279 Unspecified lack of coordination: Secondary | ICD-10-CM | POA: Insufficient documentation
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
## 2024-06-25 ENCOUNTER — Other Ambulatory Visit: Payer: Self-pay | Admitting: Physical Medicine and Rehabilitation

## 2024-06-25 DIAGNOSIS — M5416 Radiculopathy, lumbar region: Secondary | ICD-10-CM

## 2024-07-01 ENCOUNTER — Ambulatory Visit: Admitting: Physical Therapy

## 2024-07-01 DIAGNOSIS — R279 Unspecified lack of coordination: Secondary | ICD-10-CM

## 2024-07-01 DIAGNOSIS — M6281 Muscle weakness (generalized): Secondary | ICD-10-CM

## 2024-07-01 DIAGNOSIS — R293 Abnormal posture: Secondary | ICD-10-CM
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
## 2024-07-06 ENCOUNTER — Ambulatory Visit: Admitting: Physical Therapy

## 2024-07-13 ENCOUNTER — Ambulatory Visit: Admitting: Physical Therapy

## 2024-07-15 ENCOUNTER — Other Ambulatory Visit: Payer: Self-pay

## 2024-07-15 ENCOUNTER — Ambulatory Visit: Admitting: Physical Medicine and Rehabilitation

## 2024-07-15 VITALS — BP 127/79 | HR 64

## 2024-07-15 DIAGNOSIS — M5416 Radiculopathy, lumbar region: Secondary | ICD-10-CM

## 2024-07-15 MED ORDER — METHYLPREDNISOLONE ACETATE 40 MG/ML IJ SUSP
40.0000 mg | Freq: Once | INTRAMUSCULAR | Status: AC
  Administered 2024-07-15: 40 mg

## 2024-07-15 NOTE — Progress Notes (Unsigned)
 Pain Scale   Average Pain 4 Patient advising he has chronic lower back pain that radiates to Left hip at times, pain is constant.        +Driver, -BT, -Dye Allergies.

## 2024-07-20 ENCOUNTER — Ambulatory Visit: Admitting: Physical Therapy

## 2024-07-29 ENCOUNTER — Ambulatory Visit: Payer: Self-pay | Admitting: Physical Therapy

## 2024-08-05 ENCOUNTER — Ambulatory Visit: Payer: Self-pay | Admitting: Physical Therapy

## 2024-08-10 ENCOUNTER — Ambulatory Visit: Payer: Self-pay | Admitting: Physical Therapy

## 2024-08-25 ENCOUNTER — Ambulatory Visit: Admitting: Podiatry

## 2024-10-07 ENCOUNTER — Ambulatory Visit: Admitting: Urology
